# Patient Record
Sex: Female | Born: 1985 | Race: Black or African American | Hispanic: No | Marital: Single | Smoking: Never smoker
Health system: Southern US, Community
[De-identification: ages and names within clinical notes are randomized; demographics above are authoritative.]

## PROBLEM LIST (undated history)

## (undated) DIAGNOSIS — Z789 Other specified health status: Secondary | ICD-10-CM

## (undated) HISTORY — PX: NO PAST SURGERIES: SHX2092

---

## 2011-02-12 ENCOUNTER — Encounter: Payer: Self-pay | Admitting: *Deleted

## 2011-02-12 ENCOUNTER — Emergency Department (HOSPITAL_BASED_OUTPATIENT_CLINIC_OR_DEPARTMENT_OTHER)
Admission: EM | Admit: 2011-02-12 | Discharge: 2011-02-13 | Disposition: A | Discharge status: Home / Self Care | Payer: BC Managed Care – PPO | Attending: Emergency Medicine | Admitting: Emergency Medicine

## 2011-02-12 DIAGNOSIS — T7840XA Allergy, unspecified, initial encounter: Secondary | ICD-10-CM

## 2011-02-12 DIAGNOSIS — L509 Urticaria, unspecified: Secondary | ICD-10-CM | POA: Insufficient documentation

## 2011-02-12 MED ORDER — SODIUM CHLORIDE 0.9 % IV SOLN
INTRAVENOUS | Status: DC
  Administered 2011-02-12: 22:00:00 via INTRAVENOUS

## 2011-02-12 MED ORDER — DIPHENHYDRAMINE HCL 50 MG/ML IJ SOLN
25.0000 mg | Freq: Once | INTRAMUSCULAR | Status: AC
  Administered 2011-02-12: 25 mg via INTRAVENOUS
  Filled 2011-02-12: qty 1

## 2011-02-12 MED ORDER — PREDNISONE 50 MG PO TABS: 50.0000 mg | ORAL_TABLET | Freq: Every day | ORAL | Status: AC

## 2011-02-12 MED ORDER — DIPHENHYDRAMINE HCL 25 MG PO TABS: 50.0000 mg | ORAL_TABLET | Freq: Four times a day (QID) | ORAL | Status: DC

## 2011-02-12 MED ORDER — DEXAMETHASONE SODIUM PHOSPHATE 10 MG/ML IJ SOLN
10.0000 mg | Freq: Once | INTRAMUSCULAR | Status: AC
  Administered 2011-02-12: 10 mg via INTRAVENOUS
  Filled 2011-02-12: qty 1

## 2011-02-12 MED ORDER — FAMOTIDINE IN NACL 20-0.9 MG/50ML-% IV SOLN
20.0000 mg | Freq: Once | INTRAVENOUS | Status: AC
  Administered 2011-02-12: 20 mg via INTRAVENOUS
  Filled 2011-02-12: qty 50

## 2011-02-12 NOTE — ED Provider Notes (Addendum)
History     CSN: [id number] Arrival date & time: 02/12/2011  9:22 PM  Chief Complaint  Patient presents with  . Allergic Reaction   HPI Comments: 25yoF previously healthy pw hives. Pt c/o hives b/l UE this morning. States that tonight after she took a shower she began to have hives and itching over her diffuse body. Denies cp/sob/wheezing/throat swelling. Denies h/o similar. Started minocycline for acne 2 weeks ago. Started vit b and fish oil last night. No other new exposures. No h/o similar. Denies fever/chills/cp/sob/n/v. Took one benadryl pta  Patient is a 25 y.o. female presenting with allergic reaction.  Allergic Reaction    History reviewed. No pertinent past medical history.  History reviewed. No pertinent past surgical history.  No family history on file.  History  Substance Use Topics  . Smoking status: Never Smoker   . Smokeless tobacco: Not on file  . Alcohol Use: No    OB History    Grav Para Term Preterm Abortions TAB SAB Ect Mult Living                  Review of Systems  All other systems reviewed and are negative.  except as noted HPI   Physical Exam  BP 113/84  Pulse 103  Temp(Src) 98.2 F (36.8 C) (Oral)  Resp 22  SpO2 99%  Physical Exam  Nursing note and vitals reviewed. Constitutional: She is oriented to person, place, and time. She appears well-developed.  HENT:  Head: Atraumatic.  Mouth/Throat: Oropharynx is clear and moist.  Eyes: Conjunctivae and EOM are normal. Pupils are equal, round, and reactive to light.  Neck: Normal range of motion. Neck supple.       No stridor no tongue swelling  Cardiovascular: Normal rate, regular rhythm, normal heart sounds and intact distal pulses.   Pulmonary/Chest: Effort normal and breath sounds normal. No respiratory distress. She has no wheezes. She has no rales.  Abdominal: Soft. She exhibits no distension. There is no tenderness. There is no rebound and no guarding.  Musculoskeletal: Normal range  of motion.  Neurological: She is alert and oriented to person, place, and time.  Skin: Skin is warm and dry. No rash noted.       Diffuse urticaria > face neck Scattered excoriations  Psychiatric: She has a normal mood and affect.    ED Course  Procedures  MDM Allergic reaction possibly from new medications. No anaphylaxis.  IV benadryl, pepcid, decadron. Obs and reassess. Anticipate d/c home   Stefano Gaul, MD   11:51 PM  Hives resolving, itching resolved. I do not think she needs an 8 hour observation for her mild-moderate rxn. Pt continues to deny tongue swelling/neck pain/swelling. There is no wheezing. Will discharge home with steroid x 5 days and benadryl. Precautions for return.  Forbes Cellar, MD 02/12/11 2352  Forbes Cellar, MD 02/12/11 [phone]

## 2011-02-12 NOTE — ED Notes (Signed)
Hives this am on her elbows. Two hours ago she had hives over her body. Took an allergy pill 30 mins ago. Skin is burning.

## 2011-02-12 NOTE — ED Notes (Signed)
Pt given ginger ale to drink. 

## 2011-02-12 NOTE — ED Notes (Signed)
States she started taking Amoxicillin 2 weeks ago for her acne.

## 2016-05-11 ENCOUNTER — Encounter (HOSPITAL_COMMUNITY): Payer: Self-pay | Admitting: *Deleted

## 2016-05-11 ENCOUNTER — Emergency Department (HOSPITAL_COMMUNITY)
Admission: EM | Admit: 2016-05-11 | Discharge: 2016-05-11 | Disposition: A | Discharge status: Home / Self Care | Payer: PRIVATE HEALTH INSURANCE | Attending: Emergency Medicine | Admitting: Emergency Medicine

## 2016-05-11 DIAGNOSIS — R1084 Generalized abdominal pain: Secondary | ICD-10-CM | POA: Diagnosis not present

## 2016-05-11 DIAGNOSIS — E860 Dehydration: Secondary | ICD-10-CM

## 2016-05-11 DIAGNOSIS — R110 Nausea: Secondary | ICD-10-CM

## 2016-05-11 DIAGNOSIS — R112 Nausea with vomiting, unspecified: Secondary | ICD-10-CM | POA: Insufficient documentation

## 2016-05-11 LAB — URINALYSIS, ROUTINE W REFLEX MICROSCOPIC
Bilirubin Urine: NEGATIVE
Glucose, UA: NEGATIVE mg/dL
Ketones, ur: NEGATIVE mg/dL
LEUKOCYTES UA: NEGATIVE
NITRITE: NEGATIVE
Protein, ur: NEGATIVE mg/dL
SPECIFIC GRAVITY, URINE: 1.015 (ref 1.005–1.030)
pH: 7.5 (ref 5.0–8.0)

## 2016-05-11 LAB — CBC
HCT: 42.1 % (ref 36.0–46.0)
HEMOGLOBIN: 13.4 g/dL (ref 12.0–15.0)
MCH: 28.3 pg (ref 26.0–34.0)
MCHC: 31.8 g/dL (ref 30.0–36.0)
MCV: 88.8 fL (ref 78.0–100.0)
Platelets: 309 10*3/uL (ref 150–400)
RBC: 4.74 MIL/uL (ref 3.87–5.11)
RDW: 12.4 % (ref 11.5–15.5)
WBC: 10 10*3/uL (ref 4.0–10.5)

## 2016-05-11 LAB — COMPREHENSIVE METABOLIC PANEL
ALK PHOS: 80 U/L (ref 38–126)
ALT: 19 U/L (ref 14–54)
ANION GAP: 8 (ref 5–15)
AST: 25 U/L (ref 15–41)
Albumin: 4.6 g/dL (ref 3.5–5.0)
BILIRUBIN TOTAL: 1.6 mg/dL — AB (ref 0.3–1.2)
BUN: 13 mg/dL (ref 6–20)
CALCIUM: 9.4 mg/dL (ref 8.9–10.3)
CO2: 26 mmol/L (ref 22–32)
Chloride: 104 mmol/L (ref 101–111)
Creatinine, Ser: 0.67 mg/dL (ref 0.44–1.00)
GFR calc non Af Amer: >60 mL/min (ref >60)
Glucose, Bld: 89 mg/dL (ref 65–99)
POTASSIUM: 2.9 mmol/L — AB (ref 3.5–5.1)
SODIUM: 138 mmol/L (ref 135–145)
TOTAL PROTEIN: 8.6 g/dL — AB (ref 6.5–8.1)

## 2016-05-11 LAB — URINE MICROSCOPIC-ADD ON
BACTERIA UA: NONE SEEN
RBC / HPF: NONE SEEN RBC/hpf (ref 0–5)
SQUAMOUS EPITHELIAL / LPF: NONE SEEN

## 2016-05-11 LAB — LIPASE, BLOOD: Lipase: 24 U/L (ref 11–51)

## 2016-05-11 LAB — POC URINE PREG, ED: PREG TEST UR: NEGATIVE

## 2016-05-11 MED ORDER — POTASSIUM CHLORIDE CRYS ER 20 MEQ PO TBCR
40.0000 meq | EXTENDED_RELEASE_TABLET | Freq: Once | ORAL | Status: AC
  Administered 2016-05-11: 40 meq via ORAL
  Filled 2016-05-11: qty 2

## 2016-05-11 MED ORDER — ONDANSETRON 4 MG PO TBDP
4.0000 mg | ORAL_TABLET | Freq: Once | ORAL | Status: AC | PRN
  Administered 2016-05-11: 4 mg via ORAL
  Filled 2016-05-11: qty 1

## 2016-05-11 MED ORDER — SODIUM CHLORIDE 0.9 % IV BOLUS (SEPSIS)
1000.0000 mL | Freq: Once | INTRAVENOUS | Status: AC
  Administered 2016-05-11: 1000 mL via INTRAVENOUS

## 2016-05-11 MED ORDER — ONDANSETRON HCL 4 MG PO TABS: 4.0000 mg | ORAL_TABLET | Freq: Three times a day (TID) | ORAL | 0 refills | Status: DC | PRN

## 2016-05-11 NOTE — Discharge Instructions (Signed)
Please maintain her hydration and take nausea medicine as needed. Please follow-up with your primary physician for further management. If symptoms return or worsen, please return to the nearest emergency department.

## 2016-05-11 NOTE — ED Provider Notes (Signed)
[facility]-EMERGENCY DEPT Provider Note   CSN: [id number] Arrival date & time: 05/11/16  1318     History   Chief Complaint Chief Complaint  Patient presents with  . Abdominal Pain    HPI Jasmine Mcbride is a 30 y.o. female with past medical history significant for allergic reactions who presents with fatigue, malaise, chills, nausea, and full body aching. Patient says that she has had positive sick contacts over the last few days at work. She reports that she was in a long the vehicle ride with somebody who was vomiting and "had a bug". She says that she has had nausea with no vomiting, fatigue, and generalized malaise. She says that she has had decreased by mouth intake over the last few days and has not eaten anything today due to nausea. She reports she's had some moderate abdominal aching and no specific location. She denies any sharp pain. She denies any right lower quadrant or suprapubic pain. Not taking any medicine to help her symptoms. She does report some chronic arm and constipation. She denies any change in her urination including no dysuria, frequency, hesitancy. She denies any diarrhea. She denies any rhinorrhea, congestion, or cough. She reports that she was feeling lightheaded today probably her to sit down.  He denies any chest pain, shortness of breath, back pain, hematuria, headache, or vision changes. She denies any neck stiffness or neck pain.    The history is provided by the patient and medical records. No language interpreter was used.  Abdominal Pain   This is a new problem. The current episode started 2 days ago. The problem occurs constantly. The problem has been gradually improving. The pain is associated with sick contacts. The pain is located in the generalized abdominal region. The quality of the pain is aching. The pain is mild. Associated symptoms include anorexia, nausea and constipation. Pertinent negatives include fever, diarrhea, vomiting, dysuria, frequency,  hematuria and headaches. Nothing aggravates the symptoms. Nothing relieves the symptoms.      History reviewed. No pertinent past medical history.  There are no active problems to display for this patient.   History reviewed. No pertinent surgical history.  OB History    No data available       Home Medications    Prior to Admission medications   Medication Sig Start Date End Date Taking? Authorizing Provider  cetirizine (ZYRTEC) 10 MG tablet Take 10 mg by mouth daily.   Yes Historical Provider, MD  diphenhydrAMINE (BENADRYL) 25 MG tablet Take 2 tablets (50 mg total) by mouth every 6 (six) hours. Patient taking differently: Take 50 mg by mouth every 6 (six) hours as needed for itching or allergies.  02/12/11 05/11/16 Yes Forbes CellarLeigh-Ann Webb, MD  vitamin C (ASCORBIC ACID) 500 MG tablet Take 500 mg by mouth daily.   Yes Historical Provider, MD    Family History No family history on file.  Social History Social History  Substance Use Topics  . Smoking status: Never Smoker  . Smokeless tobacco: Never Used  . Alcohol use Yes     Allergies   Patient has no known allergies.   Review of Systems Review of Systems  Constitutional: Negative for activity change, chills, diaphoresis, fatigue and fever.  HENT: Negative for congestion and rhinorrhea.   Eyes: Negative for visual disturbance.  Respiratory: Negative for cough, chest tightness, shortness of breath and stridor.   Cardiovascular: Negative for chest pain, palpitations and leg swelling.  Gastrointestinal: Positive for abdominal pain, anorexia, constipation and  nausea. Negative for abdominal distention, diarrhea and vomiting.  Genitourinary: Negative for difficulty urinating, dysuria, flank pain, frequency, hematuria, menstrual problem, pelvic pain, vaginal bleeding and vaginal discharge.  Musculoskeletal: Negative for back pain and neck pain.  Skin: Negative for rash and wound.  Neurological: Positive for light-headedness  (resolved). Negative for dizziness, seizures, weakness, numbness and headaches.  Psychiatric/Behavioral: Negative for agitation and confusion.  All other systems reviewed and are negative.    Physical Exam Updated Vital Signs BP 125/76   Pulse 78   Temp 98.2 F (36.8 C)   Resp 16   LMP 05/11/2016   SpO2 100%   Physical Exam  Constitutional: She appears well-developed and well-nourished. No distress.  HENT:  Head: Normocephalic and atraumatic.  Mouth/Throat: Oropharynx is clear and moist. No oropharyngeal exudate.  Eyes: Conjunctivae and EOM are normal. Pupils are equal, round, and reactive to light.  Neck: Normal range of motion. Neck supple.  Cardiovascular: Normal rate, regular rhythm and normal heart sounds.   No murmur heard. Pulmonary/Chest: Effort normal and breath sounds normal. No stridor. No respiratory distress. She has no wheezes. She has no rales. She exhibits no tenderness.  Abdominal: Soft. There is no tenderness.  Musculoskeletal: She exhibits no edema or tenderness.  Neurological: She is alert. She displays normal reflexes. No cranial nerve deficit or sensory deficit. She exhibits normal muscle tone. Coordination normal.  Skin: Skin is warm and dry. Capillary refill takes less than 2 seconds. No rash noted. No erythema.  Psychiatric: She has a normal mood and affect.  Nursing note and vitals reviewed.    ED Treatments / Results  Labs (all labs ordered are listed, but only abnormal results are displayed) Labs Reviewed  COMPREHENSIVE METABOLIC PANEL - Abnormal; Notable for the following:       Result Value   Potassium 2.9 (*)    Total Protein 8.6 (*)    Total Bilirubin 1.6 (*)    All other components within normal limits  URINALYSIS, ROUTINE W REFLEX MICROSCOPIC (NOT AT [facility]) - Abnormal; Notable for the following:    Hgb urine dipstick MODERATE (*)    All other components within normal limits  LIPASE, BLOOD  CBC  URINE MICROSCOPIC-ADD ON  POC URINE  PREG, ED    EKG  EKG Interpretation None       Radiology No results found.  Procedures Procedures (including critical care time)  Medications Ordered in ED Medications  ondansetron (ZOFRAN-ODT) disintegrating tablet 4 mg (4 mg Oral Given 05/11/16 1342)  sodium chloride 0.9 % bolus 1,000 mL (0 mLs Intravenous Stopped 05/11/16 1738)  potassium chloride SA (K-DUR,KLOR-CON) CR tablet 40 mEq (40 mEq Oral Given 05/11/16 1428)     Initial Impression / Assessment and Plan / ED Course  I have reviewed the triage vital signs and the nursing notes.  Pertinent labs & imaging results that were available during my care of the patient were reviewed by me and considered in my medical decision making (see chart for details).  Clinical Course     Adiana TECKLA CHRISTIANSEN is a 30 y.o. female with past medical history significant for allergic reactions who presents with fatigue, malaise, chills, nausea, and full body aching.  History and exam are seen above.  On exam, patient has no focal neurologic deficits. Patient's abdomen is nontender. Patient's lungs are clear with no wheezing. Patient has no audible congestion or stridor. Patient has full range of motion of neck with unremarkable oropharyngeal exam.  Based on patient's history, suspect  likely viral infection causing chills and malaise. Suspect patient is dehydrated given decreased by mouth intake and her current nausea. Patient's lungs are clear. Do not feel patient requires chest x-ray at this time given clear lung exam and lack of pulmonary complaints. Patient will have screening laboratory testing for abdominal aching as well as looking for occult infection in the urine. Patient given Zofran with improvement in nausea. Patient will be given a liter of fluids.  Patient is currently on her menstrual cycle and denies any changes in this. Preg test negative.  Initial laboratory testing returned showing hypokalemia. Patient was supplemented with  potassium.  Anticipate reassessing following fluid resuscitation  Other laboratory testing showed no evidence of UTI, normal lipase, unremarkable CBC, and CMP again revealing the hypokalemia.   Patient felt much better with near resolution of symptoms after fluids and nausea medicine.   Given reassuring workup and improvement in symptoms, feel patient is stable for discharge. Patient will be discharged home with Zofran for nausea and to help her maintain hydration. Patient will follow-up with a PCP for further management. Patient understood return precautions. Patient had no other questions or concerns and was discharged in good condition.   Final Clinical Impressions(s) / ED Diagnoses   Final diagnoses:  Generalized abdominal pain  Nausea  Dehydration    New Prescriptions Discharge Medication List as of 05/11/2016  5:32 PM    START taking these medications   Details  ondansetron (ZOFRAN) 4 MG tablet Take 1 tablet (4 mg total) by mouth every 8 (eight) hours as needed for nausea or vomiting., Starting Tue 05/11/2016, Print        Clinical Impression: 1. Generalized abdominal pain   2. Nausea   3. Dehydration     Disposition: Discharge  Condition: Good  I have discussed the results, Dx and Tx plan with the pt(& family if present). He/she/they expressed understanding and agree(s) with the plan. Discharge instructions discussed at great length. Strict return precautions discussed and pt &/or family have verbalized understanding of the instructions. No further questions at time of discharge.    Discharge Medication List as of 05/11/2016  5:32 PM    START taking these medications   Details  ondansetron (ZOFRAN) 4 MG tablet Take 1 tablet (4 mg total) by mouth every 8 (eight) hours as needed for nausea or vomiting., Starting Tue 05/11/2016, Print        Follow Up: [facility] [address] [place]  [id number] [phone] Schedule an appointment as soon as possible for a visit    [facility] [address] [id number] [place] [phone]  If symptoms worsen     Heide Scaleshristopher J Aracelie Addis, MD 05/12/16 1109

## 2016-05-11 NOTE — ED Triage Notes (Signed)
Per EMS, pt complains of abdominal pain, weakness, nausea for the past hour. Pt was at work and had to sit down because she felt like she was going to pass out. Pt is currently on menstrual period. Bp 120/71, HR 80, 99% RA, CBG 138.

## 2017-04-25 LAB — OB RESULTS CONSOLE RUBELLA ANTIBODY, IGM: RUBELLA: IMMUNE

## 2017-04-25 LAB — OB RESULTS CONSOLE GC/CHLAMYDIA
CHLAMYDIA, DNA PROBE: NEGATIVE
Gonorrhea: NEGATIVE

## 2017-04-25 LAB — OB RESULTS CONSOLE ABO/RH: RH Type: POSITIVE

## 2017-04-25 LAB — OB RESULTS CONSOLE HEPATITIS B SURFACE ANTIGEN: HEP B S AG: NEGATIVE

## 2017-04-25 LAB — OB RESULTS CONSOLE RPR: RPR: NONREACTIVE

## 2017-04-25 LAB — OB RESULTS CONSOLE HIV ANTIBODY (ROUTINE TESTING): HIV: NONREACTIVE

## 2017-06-14 NOTE — L&D Delivery Note (Signed)
Delivery Note Called aroun 10:15 with notification that pt desired epidural and was 6cm.  Tracing cat 1.  After epidural, pt was FD/+2.  At 11:14 AM a viable and healthy female was delivered via  (Presentation: cephalic).  APGAR: pending; weight  pending.   Placenta status:spont, intact.  Cord: 3V with the following complications: none.  Cord pH: n/a  Anesthesia:  Epidural Episiotomy:  none Lacerations:  Bilateral labial lacs Suture Repair: 3.0 vicryl Est. Blood Loss (mL):  300 cc  Mom to postpartum.  Baby to NICU.  Purcell Nails 10/25/2017, 11:40 AM

## 2017-10-22 ENCOUNTER — Inpatient Hospital Stay (HOSPITAL_COMMUNITY)
Admission: AD | Admit: 2017-10-22 | Discharge: 2017-10-27 | DRG: 805 | Disposition: A | Discharge status: Home / Self Care | Payer: Managed Care, Other (non HMO) | Attending: Obstetrics & Gynecology | Admitting: Obstetrics & Gynecology

## 2017-10-22 ENCOUNTER — Encounter (HOSPITAL_COMMUNITY): Payer: Self-pay | Admitting: *Deleted

## 2017-10-22 DIAGNOSIS — O42913 Preterm premature rupture of membranes, unspecified as to length of time between rupture and onset of labor, third trimester: Principal | ICD-10-CM | POA: Diagnosis present

## 2017-10-22 DIAGNOSIS — O42113 Preterm premature rupture of membranes, onset of labor more than 24 hours following rupture, third trimester: Secondary | ICD-10-CM | POA: Diagnosis present

## 2017-10-22 DIAGNOSIS — Z3A34 34 weeks gestation of pregnancy: Secondary | ICD-10-CM

## 2017-10-22 DIAGNOSIS — O99824 Streptococcus B carrier state complicating childbirth: Secondary | ICD-10-CM | POA: Diagnosis present

## 2017-10-22 DIAGNOSIS — O98813 Other maternal infectious and parasitic diseases complicating pregnancy, third trimester: Secondary | ICD-10-CM | POA: Diagnosis present

## 2017-10-22 DIAGNOSIS — Z3490 Encounter for supervision of normal pregnancy, unspecified, unspecified trimester: Secondary | ICD-10-CM

## 2017-10-22 HISTORY — DX: Other specified health status: Z78.9

## 2017-10-22 NOTE — MAU Note (Signed)
Pt presents to MAU with complaints of ROM.  States big gush of clear fluid around 2130 with continued leaking of fluid.  Denies vaginal bleeding. +FM

## 2017-10-22 NOTE — MAU Note (Signed)
Pt reports LOF since 2130 and leaking since. + Fm Denies bleeding. Denies contractions. + Heartburn took tums no relief.

## 2017-10-23 ENCOUNTER — Inpatient Hospital Stay (HOSPITAL_COMMUNITY): Payer: Managed Care, Other (non HMO)

## 2017-10-23 ENCOUNTER — Other Ambulatory Visit: Payer: Self-pay

## 2017-10-23 DIAGNOSIS — O98813 Other maternal infectious and parasitic diseases complicating pregnancy, third trimester: Secondary | ICD-10-CM | POA: Diagnosis present

## 2017-10-23 DIAGNOSIS — Z3A34 34 weeks gestation of pregnancy: Secondary | ICD-10-CM | POA: Diagnosis not present

## 2017-10-23 DIAGNOSIS — O42113 Preterm premature rupture of membranes, onset of labor more than 24 hours following rupture, third trimester: Secondary | ICD-10-CM | POA: Diagnosis present

## 2017-10-23 DIAGNOSIS — O99824 Streptococcus B carrier state complicating childbirth: Secondary | ICD-10-CM | POA: Diagnosis present

## 2017-10-23 DIAGNOSIS — O42913 Preterm premature rupture of membranes, unspecified as to length of time between rupture and onset of labor, third trimester: Secondary | ICD-10-CM | POA: Diagnosis present

## 2017-10-23 LAB — TYPE AND SCREEN
ABO/RH(D): A POS
Antibody Screen: NEGATIVE

## 2017-10-23 LAB — GROUP B STREP BY PCR: GROUP B STREP BY PCR: POSITIVE — AB

## 2017-10-23 LAB — URINALYSIS, ROUTINE W REFLEX MICROSCOPIC
Bilirubin Urine: NEGATIVE
GLUCOSE, UA: NEGATIVE mg/dL
HGB URINE DIPSTICK: NEGATIVE
Ketones, ur: NEGATIVE mg/dL
Leukocytes, UA: NEGATIVE
Nitrite: NEGATIVE
Protein, ur: NEGATIVE mg/dL
SPECIFIC GRAVITY, URINE: 1.012 (ref 1.005–1.030)
pH: 8 (ref 5.0–8.0)

## 2017-10-23 LAB — CBC
HCT: 30.7 % — ABNORMAL LOW (ref 36.0–46.0)
HEMOGLOBIN: 9.9 g/dL — AB (ref 12.0–15.0)
MCH: 28.4 pg (ref 26.0–34.0)
MCHC: 32.2 g/dL (ref 30.0–36.0)
MCV: 88.2 fL (ref 78.0–100.0)
PLATELETS: 210 10*3/uL (ref 150–400)
RBC: 3.48 MIL/uL — AB (ref 3.87–5.11)
RDW: 13.7 % (ref 11.5–15.5)
WBC: 8.2 10*3/uL (ref 4.0–10.5)

## 2017-10-23 LAB — ABO/RH: ABO/RH(D): A POS

## 2017-10-23 LAB — POCT FERN TEST: POCT Fern Test: POSITIVE

## 2017-10-23 MED ORDER — BETAMETHASONE SOD PHOS & ACET 6 (3-3) MG/ML IJ SUSP
12.0000 mg | INTRAMUSCULAR | Status: AC
  Administered 2017-10-23: 12 mg via INTRAMUSCULAR
  Filled 2017-10-23: qty 2

## 2017-10-23 MED ORDER — CALCIUM CARBONATE ANTACID 500 MG PO CHEW
2.0000 | CHEWABLE_TABLET | ORAL | Status: DC | PRN
  Administered 2017-10-23 – 2017-10-25 (×2): 400 mg via ORAL
  Filled 2017-10-23 (×2): qty 2

## 2017-10-23 MED ORDER — AZITHROMYCIN 250 MG PO TABS
1000.0000 mg | ORAL_TABLET | Freq: Once | ORAL | Status: AC
  Administered 2017-10-23: 1000 mg via ORAL
  Filled 2017-10-23: qty 4

## 2017-10-23 MED ORDER — AMOXICILLIN 500 MG PO CAPS
500.0000 mg | ORAL_CAPSULE | Freq: Three times a day (TID) | ORAL | Status: DC
  Filled 2017-10-23 (×2): qty 1

## 2017-10-23 MED ORDER — LACTATED RINGERS IV SOLN
INTRAVENOUS | Status: DC
  Administered 2017-10-23 – 2017-10-24 (×4): via INTRAVENOUS

## 2017-10-23 MED ORDER — BETAMETHASONE SOD PHOS & ACET 6 (3-3) MG/ML IJ SUSP
12.0000 mg | INTRAMUSCULAR | Status: DC
  Administered 2017-10-23: 12 mg via INTRAMUSCULAR
  Filled 2017-10-23: qty 2

## 2017-10-23 MED ORDER — FERROUS SULFATE 325 (65 FE) MG PO TABS
325.0000 mg | ORAL_TABLET | Freq: Every day | ORAL | Status: DC
Start: 2017-10-24 — End: 2017-10-25
  Administered 2017-10-24: 325 mg via ORAL
  Filled 2017-10-23 (×2): qty 1

## 2017-10-23 MED ORDER — DOCUSATE SODIUM 100 MG PO CAPS
100.0000 mg | ORAL_CAPSULE | Freq: Every day | ORAL | Status: DC
  Administered 2017-10-23 – 2017-10-24 (×2): 100 mg via ORAL
  Filled 2017-10-23 (×3): qty 1

## 2017-10-23 MED ORDER — SODIUM CHLORIDE 0.9 % IV SOLN
2.0000 g | Freq: Four times a day (QID) | INTRAVENOUS | Status: AC
  Administered 2017-10-23 – 2017-10-24 (×8): 2 g via INTRAVENOUS
  Filled 2017-10-23 (×8): qty 2

## 2017-10-23 MED ORDER — FAMOTIDINE 20 MG PO TABS
20.0000 mg | ORAL_TABLET | Freq: Every day | ORAL | Status: DC
  Administered 2017-10-23 – 2017-10-24 (×3): 20 mg via ORAL
  Filled 2017-10-23 (×3): qty 1

## 2017-10-23 MED ORDER — MAGNESIUM SULFATE BOLUS VIA INFUSION
4.0000 g | Freq: Once | INTRAVENOUS | Status: AC
  Administered 2017-10-23: 4 g via INTRAVENOUS
  Filled 2017-10-23: qty 500

## 2017-10-23 MED ORDER — PRENATAL MULTIVITAMIN CH
1.0000 | ORAL_TABLET | Freq: Every day | ORAL | Status: DC
  Administered 2017-10-23 – 2017-10-24 (×2): 1 via ORAL
  Filled 2017-10-23 (×4): qty 1

## 2017-10-23 MED ORDER — ZOLPIDEM TARTRATE 5 MG PO TABS
5.0000 mg | ORAL_TABLET | Freq: Every evening | ORAL | Status: DC | PRN
  Administered 2017-10-23 – 2017-10-25 (×2): 5 mg via ORAL
  Filled 2017-10-23 (×2): qty 1

## 2017-10-23 MED ORDER — ACETAMINOPHEN 325 MG PO TABS
650.0000 mg | ORAL_TABLET | ORAL | Status: DC | PRN
  Administered 2017-10-23: 650 mg via ORAL
  Filled 2017-10-23: qty 2

## 2017-10-23 MED ORDER — MAGNESIUM SULFATE 40 G IN LACTATED RINGERS - SIMPLE
2.0000 g/h | INTRAVENOUS | Status: AC
  Filled 2017-10-23: qty 500

## 2017-10-23 NOTE — Consult Note (Signed)
Asked by Dr.Roberts to provide prenatal consultation for patient at risk for preterm delivery due to PPROM at 33.[redacted] wks EGA . Patient is 32 y.o. G2 P0, pregnancy previoulsly uncomplicated.  She is being treated with betamethasone (2nd dose about 12:30 today), MgSO4, and antibiotics.  No signs of infection at this time.  Discussed usual expectations for preterm infant at 19 - [redacted] weeks gestation, including possible needs for DR resuscitation, respiratory support, IV access.  Projected possible length of stay in NICU until 37 - [redacted] wks EGA.  Discussed advantages of feeding with mother's milk.  She plans to pump postnatally.  Also mentioned possible use of donor milk.  Patient was attentive, had appropriate questions, and was appreciative of my input.  Thank you for consulting Neonatology.  Total time 20  JWimmer, MD

## 2017-10-23 NOTE — H&P (Signed)
Jasmine Mcbride is a 32 y.o. female presenting for PPROM at [redacted]w[redacted]d. Patient reports gush of fluid at 2130 with occasional mild contractions.   OB History    Gravida  2   Para      Term      Preterm      AB  1   Living        SAB  1   TAB      Ectopic      Multiple      Live Births             Past Medical History:  Diagnosis Date  . Medical history non-contributory    Past Surgical History:  Procedure Laterality Date  . NO PAST SURGERIES     Family History: family history is not on file. Social History:  reports that she has never smoked. She has never used smokeless tobacco. She reports that she drinks alcohol. She reports that she does not use drugs.     Maternal Diabetes: No Genetic Screening: Declined Maternal Ultrasounds/Referrals: Normal Fetal Ultrasounds or other Referrals:  Referred to Materal Fetal Medicine today for evaluation of PPROM and Growth U/S Maternal Substance Abuse:  No Significant Maternal Medications:  None Significant Maternal Lab Results:  None Other Comments:  None  Review of Systems  All other systems reviewed and are negative.  Maternal Medical History:  Reason for admission: Rupture of membranes.   Contractions: Frequency: rare.   Perceived severity is mild.    Fetal activity: Perceived fetal activity is normal.   Last perceived fetal movement was within the past hour.    Prenatal complications: no prenatal complications Prenatal Complications - Diabetes: none.    Vitals:   10/22/17 2301  BP: 117/69  Pulse: 83  Resp: 16  Temp: 97.8 F (36.6 C)  SpO2: 98%   Maternal Exam:  Abdomen: Patient reports no abdominal tenderness. Fetal presentation: vertex  Introitus: Normal vulva. Normal vagina.  Ferning test: positive.  Amniotic fluid character: clear.  Pelvis: adequate for delivery.   Cervix: Cervix evaluated by sterile speculum exam.     Physical Exam  Constitutional: She is oriented to person, place, and  time. She appears well-developed and well-nourished.  HENT:  Head: Normocephalic and atraumatic.  Eyes: Pupils are equal, round, and reactive to light. Conjunctivae are normal.  Cardiovascular: Normal rate, regular rhythm and normal heart sounds.  Respiratory: Effort normal and breath sounds normal.  GI: Soft. Bowel sounds are normal.  Genitourinary: Vagina normal.  Musculoskeletal: Normal range of motion.  Neurological: She is alert and oriented to person, place, and time.  Skin: Skin is warm and dry.  Psychiatric: She has a normal mood and affect. Her behavior is normal. Judgment and thought content normal.  Cervical exam: closed, long, firm, posterior with clear fluid pooling at posterior fornix.   Prenatal labs: ABO, Rh:  A+ Antibody:  Negative Rubella:  Immune RPR:   Negative HBsAg:   Nonreactive HIV:   Nonreactive  GBS:   Unknown  Assessment/Plan: G2P0010 at [redacted]w[redacted]d  PPROM GBS status unknown  Admit to antepartum Betamethasone x2 doses Zithromax 1g once, Ampicillin 2g q6hrs for 48 hours then Amoxicillin 500 po TID for 5 days Labs: CBC and GBS  Consult Dr. Mora Appl Consult MFM Growth U/S    [name] 10/23/2017, 12:33 AM

## 2017-10-23 NOTE — Progress Notes (Addendum)
Patient ID: Jasmine Mcbride, female   DOB: 02/28/1986, 32 y.o.   MRN: [id number] Jasmine Mcbride is a 32 y.o. G2P0010 at [redacted]w[redacted]d admitted for PPROM clear fluid  Hospital Day No: 1  Subjective: Pt reporting contractions q5 min.  RN reports her breathing through them.  The nurse, Candise Bowens, called me after pt called out reporting contractions. Orders given.  Upon my arrival, pt denied any VB.  Objective: BP 104/65 (BP Location: Left Arm)   Pulse (!) 103   Temp 98.8 F (37.1 C) (Oral)   Resp 18   Ht  (1.549 m)   Wt 169 lb (76.7 kg)   LMP 03/05/2017 (Exact Date)   SpO2 97%   BMI 31.93 kg/m  I/O last 3 completed shifts: In: 818 [P.O.:118; I.V.:500; IV Piggyback:200] Out: 350 [Urine:100; Emesis/NG output:250] Total I/O In: -  Out: 500 [Urine:500]  Physical Exam:  Gen: alert and no distress Chest/Lungs: cta bilaterally  Heart/Pulse: RRR  Abdomen: soft, gravid, nontender Uterine fundus: gravid, nontender Skin & Color: warm and dry  EXT: negative Homan's b/l, edema none  FHT:  FHR: 135 bpm, variability: moderate,  accelerations:  Present,  decelerations:  Absent UC:   Not picking up well but q by report from pt and nurse (RN reports pt huffing and puffing q78min) SVE:   Dilation: Closed Effacement (%): 80  Labs: Lab Results  Component Value Date   WBC 8.2 10/23/2017   HGB 9.9 (L) 10/23/2017   HCT 30.7 (L) 10/23/2017   MCV 88.2 10/23/2017   PLT 210 10/23/2017   EFW 5lbs 1oz on u/s this morning, vtx per pt report.  Official report is pending.  Assessment and Plan: has Preterm premature rupture of membranes (PPROM) with onset of labor after 24 hours of rupture in third trimester, antepartum on their problem list.  Mg ordered for neuroprophylaxis when nurse called reporting regular ctxs GBS Cx pending but d/t contractions, will collect GBS PCR Cont amp.  Pt is s/p zithromax x 1 dose. Fetal status is cat 1 Neonatology consult  Purcell Nails 10/23/2017, 11:12 AM

## 2017-10-23 NOTE — Progress Notes (Signed)
Pt back in room from ultrasound.

## 2017-10-23 NOTE — Progress Notes (Addendum)
0214: Tylenol 650 mg administered for abdominal cramping 7/10  2216: Ambien given for insomnia

## 2017-10-23 NOTE — Progress Notes (Signed)
0540: Pt C/O feeling hot and vomited 250 mls green liquid. No nausea medication required. We will continue to monitor.

## 2017-10-23 NOTE — Progress Notes (Signed)
CRITICAL VALUE ALERT  Critical Value:  GBS +  Date & Time Notied:  10/23/17  Provider Notified: Dr. Su Hilt  Orders Received/Actions taken: No new orders at this time.

## 2017-10-23 NOTE — Progress Notes (Signed)
Neonatologist aware of neo consult order. 

## 2017-10-23 NOTE — Progress Notes (Signed)
Pt going down for ultrasound. 

## 2017-10-23 NOTE — Progress Notes (Signed)
Possibly tracing maternal HR from 09:46 to 09:53. Went to assess the pt. Pt was sitting up in bed to eat. Re-adjusted monitor.

## 2017-10-24 ENCOUNTER — Inpatient Hospital Stay (HOSPITAL_COMMUNITY): Payer: Managed Care, Other (non HMO)

## 2017-10-24 LAB — CULTURE, BETA STREP (GROUP B ONLY)

## 2017-10-24 LAB — OB RESULTS CONSOLE GBS: STREP GROUP B AG: POSITIVE

## 2017-10-24 NOTE — Progress Notes (Addendum)
Mcbride, Jasmine, DOB 11/27/1985  Subjective: Patient reports some thighs swelling.  Otherwise denies any fevers/chills/abdominal pain.She continues with small leakage of amniotic fluid.  She denies contractions, or vaginal bleeding. With normal gross fetal movement.  Objective: Blood pressure 116/69, pulse 83, temperature 98.1 F (36.7 C), temperature source Oral, resp. rate 18, height  (1.549 m), weight 76.7 kg (169 lb), last menstrual period 03/05/2017, SpO2 100 %. General: alert, cooperative and no distress Resp: clear to auscultation bilaterally Cardio: regular rate and rhythm, S1, S2 normal, no murmur, click, rub or gallop GI: soft, non-tender; bowel sounds normal; no masses,  no organomegaly Extremities: edema 1+ bilaterally in bilateral lower extremities.  No erythema or evidence of DVTs.  NST: category 1.  TOCO: No contractions.  Assessment/Plan: 32 y/o G2P0010 @ 33 weeks 6 days EGA with PPROM,  -Beta complete today 10/24/17 at 1230pm.  -On latency antibiotics - S/p magnesium sulfate for neuroprotection and tocolysis earlier in admission -As per MFM consultation plan is to induce patient at 34 weeks- tonight.  Last exam patient was closed, thick.   -Pt. Is s/p NICU consultation.   LOS: 1 day  Konrad Felix, MD.  10/24/2017, 11:20 AM

## 2017-10-24 NOTE — Consult Note (Signed)
Maternal Fetal Medicine Consultation  Requesting Provider(s): [facility]  Primary OB: [facility] Reason for consultation: PPROM  HPI: 31yo P0010 at 33+6 weeks with uncomplicated PPROM with gross PROM and low AFI OB History: OB History    Gravida  2   Para      Term      Preterm      AB  1   Living        SAB  1   TAB      Ectopic      Multiple      Live Births              PMH:  Past Medical History:  Diagnosis Date  . Medical history non-contributory     PSH:  Past Surgical History:  Procedure Laterality Date  . NO PAST SURGERIES     Meds: Has completed betamethasone series. On ampicillin and azithromycin for latency antibiotics Allergies: doxycycline FH: See EPIC Soc: See EPIC  Review of Systems: no vaginal bleeding or cramping/contractions, no LOF, no nausea/vomiting. All other systems reviewed and are negative.  PE:  VS: See EPIC GEN: well-appearing female ABD: gravid, NT  Please see separate document for fetal ultrasound report.  A/P: Uncomplicated PPROM at 33+6 weeks. All preparatory actions have been taken. Recommend proceeding with delivery tomorrow at 34+0 weeks. Discussed these recommendations with the patient and her family  Thank you for the opportunity to be a part of the care of Jasmine Mcbride. Please contact our office if we can be of further assistance.   I spent approximately 15 minutes with this patient with over 50% of time spent in face-to-face counseling.

## 2017-10-25 ENCOUNTER — Inpatient Hospital Stay (HOSPITAL_COMMUNITY): Payer: Managed Care, Other (non HMO) | Admitting: Anesthesiology

## 2017-10-25 ENCOUNTER — Encounter (HOSPITAL_COMMUNITY): Payer: Self-pay | Admitting: *Deleted

## 2017-10-25 DIAGNOSIS — Z3490 Encounter for supervision of normal pregnancy, unspecified, unspecified trimester: Secondary | ICD-10-CM

## 2017-10-25 LAB — CBC
HCT: 27.6 % — ABNORMAL LOW (ref 36.0–46.0)
Hemoglobin: 8.8 g/dL — ABNORMAL LOW (ref 12.0–15.0)
MCH: 28.7 pg (ref 26.0–34.0)
MCHC: 31.9 g/dL (ref 30.0–36.0)
MCV: 89.9 fL (ref 78.0–100.0)
Platelets: 212 10*3/uL (ref 150–400)
RBC: 3.07 MIL/uL — AB (ref 3.87–5.11)
RDW: 14.2 % (ref 11.5–15.5)
WBC: 12.2 10*3/uL — AB (ref 4.0–10.5)

## 2017-10-25 LAB — TYPE AND SCREEN
ABO/RH(D): A POS
Antibody Screen: NEGATIVE

## 2017-10-25 LAB — RPR: RPR Ser Ql: NONREACTIVE

## 2017-10-25 MED ORDER — OXYCODONE-ACETAMINOPHEN 5-325 MG PO TABS: 1.0000 | ORAL_TABLET | ORAL | Status: DC | PRN

## 2017-10-25 MED ORDER — LIDOCAINE HCL (PF) 1 % IJ SOLN
30.0000 mL | INTRAMUSCULAR | Status: AC | PRN
  Administered 2017-10-25: 30 mL via SUBCUTANEOUS
  Filled 2017-10-25: qty 30

## 2017-10-25 MED ORDER — OXYCODONE HCL 5 MG PO TABS: 10.0000 mg | ORAL_TABLET | ORAL | Status: DC | PRN

## 2017-10-25 MED ORDER — FENTANYL 2.5 MCG/ML BUPIVACAINE 1/10 % EPIDURAL INFUSION (WH - ANES)
14.0000 mL/h | INTRAMUSCULAR | Status: DC | PRN
  Administered 2017-10-25: 14 mL/h via EPIDURAL
  Filled 2017-10-25: qty 100

## 2017-10-25 MED ORDER — LIDOCAINE HCL (PF) 1 % IJ SOLN
INTRAMUSCULAR | Status: DC | PRN
  Administered 2017-10-25 (×2): 5 mL via EPIDURAL

## 2017-10-25 MED ORDER — SOD CITRATE-CITRIC ACID 500-334 MG/5ML PO SOLN: 30.0000 mL | ORAL | Status: DC | PRN

## 2017-10-25 MED ORDER — MISOPROSTOL 200 MCG PO TABS: 800.0000 ug | ORAL_TABLET | Freq: Once | ORAL | Status: DC

## 2017-10-25 MED ORDER — PENICILLIN G POT IN DEXTROSE 60000 UNIT/ML IV SOLN
3.0000 10*6.[IU] | INTRAVENOUS | Status: DC
  Administered 2017-10-25: 3 10*6.[IU] via INTRAVENOUS
  Filled 2017-10-25 (×2): qty 50

## 2017-10-25 MED ORDER — DIPHENHYDRAMINE HCL 25 MG PO CAPS: 25.0000 mg | ORAL_CAPSULE | Freq: Four times a day (QID) | ORAL | Status: DC | PRN

## 2017-10-25 MED ORDER — EPHEDRINE 5 MG/ML INJ
10.0000 mg | INTRAVENOUS | Status: DC | PRN
  Filled 2017-10-25: qty 2

## 2017-10-25 MED ORDER — COCONUT OIL OIL: 1.0000 "application " | TOPICAL_OIL | Status: DC | PRN

## 2017-10-25 MED ORDER — ACETAMINOPHEN 325 MG PO TABS: 650.0000 mg | ORAL_TABLET | ORAL | Status: DC | PRN

## 2017-10-25 MED ORDER — OXYCODONE HCL 5 MG PO TABS: 5.0000 mg | ORAL_TABLET | ORAL | Status: DC | PRN

## 2017-10-25 MED ORDER — LACTATED RINGERS IV SOLN: 500.0000 mL | Freq: Once | INTRAVENOUS | Status: DC

## 2017-10-25 MED ORDER — MISOPROSTOL 200 MCG PO TABS
ORAL_TABLET | ORAL | Status: AC
  Filled 2017-10-25: qty 4

## 2017-10-25 MED ORDER — ONDANSETRON HCL 4 MG PO TABS: 4.0000 mg | ORAL_TABLET | ORAL | Status: DC | PRN

## 2017-10-25 MED ORDER — SIMETHICONE 80 MG PO CHEW: 80.0000 mg | CHEWABLE_TABLET | ORAL | Status: DC | PRN

## 2017-10-25 MED ORDER — SODIUM CHLORIDE 0.9 % IV SOLN
5.0000 10*6.[IU] | Freq: Once | INTRAVENOUS | Status: AC
  Administered 2017-10-25: 5 10*6.[IU] via INTRAVENOUS
  Filled 2017-10-25: qty 5

## 2017-10-25 MED ORDER — ONDANSETRON HCL 4 MG/2ML IJ SOLN: 4.0000 mg | Freq: Four times a day (QID) | INTRAMUSCULAR | Status: DC | PRN

## 2017-10-25 MED ORDER — PHENYLEPHRINE 40 MCG/ML (10ML) SYRINGE FOR IV PUSH (FOR BLOOD PRESSURE SUPPORT)
80.0000 ug | PREFILLED_SYRINGE | INTRAVENOUS | Status: DC | PRN
  Filled 2017-10-25: qty 5

## 2017-10-25 MED ORDER — SENNOSIDES-DOCUSATE SODIUM 8.6-50 MG PO TABS
2.0000 | ORAL_TABLET | ORAL | Status: DC
  Administered 2017-10-25 – 2017-10-26 (×2): 2 via ORAL
  Filled 2017-10-25 (×2): qty 2

## 2017-10-25 MED ORDER — LORATADINE 10 MG PO TABS
10.0000 mg | ORAL_TABLET | Freq: Every day | ORAL | Status: DC | PRN
  Filled 2017-10-25: qty 1

## 2017-10-25 MED ORDER — IBUPROFEN 600 MG PO TABS
600.0000 mg | ORAL_TABLET | Freq: Four times a day (QID) | ORAL | Status: DC
  Administered 2017-10-25 – 2017-10-27 (×7): 600 mg via ORAL
  Filled 2017-10-25 (×7): qty 1

## 2017-10-25 MED ORDER — TETANUS-DIPHTH-ACELL PERTUSSIS 5-2.5-18.5 LF-MCG/0.5 IM SUSP: 0.5000 mL | Freq: Once | INTRAMUSCULAR | Status: DC

## 2017-10-25 MED ORDER — PRENATAL MULTIVITAMIN CH
1.0000 | ORAL_TABLET | Freq: Every day | ORAL | Status: DC
  Administered 2017-10-26 – 2017-10-27 (×2): 1 via ORAL
  Filled 2017-10-25 (×2): qty 1

## 2017-10-25 MED ORDER — OXYTOCIN 40 UNITS IN LACTATED RINGERS INFUSION - SIMPLE MED
2.5000 [IU]/h | INTRAVENOUS | Status: DC
  Filled 2017-10-25: qty 1000

## 2017-10-25 MED ORDER — OXYTOCIN BOLUS FROM INFUSION
500.0000 mL | Freq: Once | INTRAVENOUS | Status: AC
  Administered 2017-10-25: 500 mL via INTRAVENOUS

## 2017-10-25 MED ORDER — FENTANYL CITRATE (PF) 100 MCG/2ML IJ SOLN: 50.0000 ug | INTRAMUSCULAR | Status: DC | PRN

## 2017-10-25 MED ORDER — FLEET ENEMA 7-19 GM/118ML RE ENEM: 1.0000 | ENEMA | Freq: Every day | RECTAL | Status: DC | PRN

## 2017-10-25 MED ORDER — ZOLPIDEM TARTRATE 5 MG PO TABS: 5.0000 mg | ORAL_TABLET | Freq: Every evening | ORAL | Status: DC | PRN

## 2017-10-25 MED ORDER — MEDROXYPROGESTERONE ACETATE 150 MG/ML IM SUSP: 150.0000 mg | INTRAMUSCULAR | Status: DC | PRN

## 2017-10-25 MED ORDER — DIBUCAINE 1 % RE OINT: 1.0000 "application " | TOPICAL_OINTMENT | RECTAL | Status: DC | PRN

## 2017-10-25 MED ORDER — LACTATED RINGERS IV SOLN
INTRAVENOUS | Status: DC
  Administered 2017-10-25: 03:00:00 via INTRAVENOUS

## 2017-10-25 MED ORDER — BENZOCAINE-MENTHOL 20-0.5 % EX AERO
1.0000 "application " | INHALATION_SPRAY | CUTANEOUS | Status: DC | PRN
  Administered 2017-10-25: 1 via TOPICAL
  Filled 2017-10-25: qty 56

## 2017-10-25 MED ORDER — DIPHENHYDRAMINE HCL 50 MG/ML IJ SOLN: 12.5000 mg | INTRAMUSCULAR | Status: DC | PRN

## 2017-10-25 MED ORDER — ONDANSETRON HCL 4 MG/2ML IJ SOLN: 4.0000 mg | INTRAMUSCULAR | Status: DC | PRN

## 2017-10-25 MED ORDER — LACTATED RINGERS IV SOLN: 500.0000 mL | INTRAVENOUS | Status: DC | PRN

## 2017-10-25 MED ORDER — TERBUTALINE SULFATE 1 MG/ML IJ SOLN: 0.2500 mg | Freq: Once | INTRAMUSCULAR | Status: DC | PRN

## 2017-10-25 MED ORDER — OXYCODONE-ACETAMINOPHEN 5-325 MG PO TABS: 2.0000 | ORAL_TABLET | ORAL | Status: DC | PRN

## 2017-10-25 MED ORDER — WITCH HAZEL-GLYCERIN EX PADS: 1.0000 "application " | MEDICATED_PAD | CUTANEOUS | Status: DC | PRN

## 2017-10-25 MED ORDER — PHENYLEPHRINE 40 MCG/ML (10ML) SYRINGE FOR IV PUSH (FOR BLOOD PRESSURE SUPPORT)
80.0000 ug | PREFILLED_SYRINGE | INTRAVENOUS | Status: DC | PRN
  Filled 2017-10-25: qty 10
  Filled 2017-10-25: qty 5

## 2017-10-25 MED ORDER — MISOPROSTOL 25 MCG QUARTER TABLET
25.0000 ug | ORAL_TABLET | ORAL | Status: DC | PRN
  Administered 2017-10-25 (×2): 25 ug via ORAL
  Filled 2017-10-25 (×3): qty 1

## 2017-10-25 NOTE — Progress Notes (Signed)
CSW acknowledges NICU admission.    Patient screened out for psychosocial assessment since none of the following apply:  Psychosocial stressors documented in mother or baby's chart  Gestation less than 32 weeks  Code at delivery   Infant with anomalies  Please contact the Clinical Social Worker if specific needs arise, or by MOB's request.       

## 2017-10-25 NOTE — Anesthesia Pain Management Evaluation Note (Signed)
  [facility] Pain Management Visit Note  Patient: Jasmine Mcbride, 32 y.o., female  "Hello I am a member of the anesthesia team at [facility]. We have an anesthesia team available at all times to provide care throughout the hospital, including epidural management and anesthesia for C-section. I don't know your plan for the delivery whether it a natural birth, water birth, IV sedation, nitrous supplementation, doula or epidural, but we want to meet your pain goals."   1.Was your pain managed to your expectations on prior hospitalizations?   Yes   2.What is your expectation for pain management during this hospitalization?     Epidural  3.How can we help you reach that goal? epidural  Record the patient's initial score and the patient's pain goal.   Pain: 2/10  Pain Goal: 1/10 [facility] wants you to be able to say your pain was always managed very well.  Salome Arnt 10/25/2017

## 2017-10-25 NOTE — Anesthesia Procedure Notes (Signed)
Epidural Patient location during procedure: OB  Staffing Anesthesiologist: Emerick Weatherly, MD Performed: anesthesiologist   Preanesthetic Checklist Completed: patient identified, site marked, surgical consent, pre-op evaluation, timeout performed, IV checked, risks and benefits discussed and monitors and equipment checked  Epidural Patient position: sitting Prep: DuraPrep Patient monitoring: heart rate, continuous pulse ox and blood pressure Approach: midline Location: L3-L4 Injection technique: LOR saline  Needle:  Needle type: Tuohy  Needle gauge: 17 G Needle length: 9 cm and 9 Needle insertion depth: 6 cm Catheter type: closed end flexible Catheter size: 20 Guage Catheter at skin depth: 10 cm Test dose: negative  Assessment Events: blood not aspirated, injection not painful, no injection resistance, negative IV test and no paresthesia  Additional Notes Patient identified. Risks/Benefits/Options discussed with patient including but not limited to bleeding, infection, nerve damage, paralysis, failed block, incomplete pain control, headache, blood pressure changes, nausea, vomiting, reactions to medication both or allergic, itching and postpartum back pain. Confirmed with bedside nurse the patient's most recent platelet count. Confirmed with patient that they are not currently taking any anticoagulation, have any bleeding history or any family history of bleeding disorders. Patient expressed understanding and wished to proceed. All questions were answered. Sterile technique was used throughout the entire procedure. Please see nursing notes for vital signs. Test dose was given through epidural needle and negative prior to continuing to dose epidural or start infusion. Warning signs of high block given to the patient including shortness of breath, tingling/numbness in hands, complete motor block, or any concerning symptoms with instructions to call for help. Patient was given  instructions on fall risk and not to get out of bed. All questions and concerns addressed with instructions to call with any issues.     

## 2017-10-25 NOTE — H&P (Signed)
Updated H/P   Jasmine Mcbride 32 y.o. G2P0010 @ [redacted]w[redacted]d with PPROM since 10/23/17 has been in house since that time and is being induced tonight per MFM recommendation. She has been given latency abx and a completed course of betamethasone and magnesium sulfate for 12 hours for CP prophylaxis.  ROS- all systems negative except for occasional leaking of amniotic fluid Exam-  Alert oriented x3 Heart -RRR Lungs- CTA Abd- NT Extremities- wnl, no s/sx of dvt  FHT's- baseline 125 with positive accels Uc's rare  Prenatal labs: ABO, Rh:  A+ Antibody:  Negative Rubella:  Immune RPR:   Negative HBsAg:   Nonreactive HIV:   Nonreactive  GBS:  Positive  EFW- MFM U/S on 10/23/17 2305gm )5pounds 1oz0 63%  BP (!) 103/51   Pulse 71   Temp 98.2 F (36.8 C) (Oral)   Resp 16   Ht  (1.549 m)   Wt 169 lb (76.7 kg)   LMP 03/05/2017 (Exact Date)   SpO2 98%   BMI 31.93 kg/m    CBC and updated type and screen pending  A1 IUP @ 34 weeks    2. PPROM    3. GBS positive    4. Cervical ripening; Pitocin Induction P1 Transfer to L/D    2. Routine L/D orders   3.Cytotec PO for cervical ripening   4.PCN for GBS prophylaxis   5. Epidural prn   6. Anticipate vaginal delivery  Illene Bolus CNM 10/25/17 240am

## 2017-10-25 NOTE — Anesthesia Preprocedure Evaluation (Signed)

## 2017-10-26 LAB — CBC
HEMATOCRIT: 32.5 % — AB (ref 36.0–46.0)
HEMOGLOBIN: 10.4 g/dL — AB (ref 12.0–15.0)
MCH: 28.4 pg (ref 26.0–34.0)
MCHC: 32 g/dL (ref 30.0–36.0)
MCV: 88.8 fL (ref 78.0–100.0)
Platelets: 229 10*3/uL (ref 150–400)
RBC: 3.66 MIL/uL — AB (ref 3.87–5.11)
RDW: 14.1 % (ref 11.5–15.5)
WBC: 12.5 10*3/uL — AB (ref 4.0–10.5)

## 2017-10-26 NOTE — Lactation Note (Signed)
This note was copied from a baby's chart. Lactation Consultation Note  Patient Name: Jasmine Mcbride [id number] Date: 10/26/2017 Reason for consult: Initial assessment;NICU baby;Late-preterm 34-36.6wks;Infant < 6lbs;1st time breastfeeding;Primapara;Mother's request   Initial assessment with mom in the NICU. Infant was latched to the right breast in the football hold by RN. Mom reports infant did latch and suckle for a brief period. Infant came off breast as LC entered room. Mom held him at the breast and he was being NG fed at the same time.   Mom reports she is pumping with DEBP on Initiate setting and she obtained 5-10 cc colostrum that was brought to infant. Enc mom to pump 8-12 x in 24 hours and follow with hand expression, mom needs to be taught hand expression. Reviewed colostrum and milk coming to volume. Reviewed with mom what to expect with LPT infant related to BF.  Mom has breast milk labels and containers for storage. Mom has ordered a pump from her insurance company, she is unsure of brand and whether it has arrived yet. Reviewed with mom that pumping rooms in the NICU. Mom to let us know if her pump is here before d/c.   Parents report they have no further questions/concerns at this time. Will attempt to see mom in there PP room to teach hand expression later today.    Maternal Data Formula Feeding for Exclusion: No Has patient been taught Hand Expression?: No Does the patient have breastfeeding experience prior to this delivery?: No  Feeding Feeding Type: Formula  LATCH Score                   Interventions Interventions: Breast feeding basics reviewed;Support pillows;Skin to skin  Lactation Tools Discussed/Used Pump Review: Setup, frequency, and cleaning;Milk Storage Initiated by:: RN, reviewed and encouraged 8-12 x a day and follow with hand expression   Consult Status Consult Status: Follow-up Date: 10/27/17 Follow-up type: In-patient    Jasmine Mcbride  Jasmine Mcbride 10/26/2017, 11:45 AM

## 2017-10-26 NOTE — Lactation Note (Signed)
This note was copied from a baby's chart. Lactation Consultation Note  Patient Name: Jasmine Mcbride [id number] Date: 10/26/2017 Reason for consult: Follow-up assessment;NICU baby;1st time breastfeeding;Primapara;Late-preterm 34-36.6wks   Follow up with mom in her room. BF Resources handout and LC Brochure given, mom informed of IP/OP Services, BF Support Groups and LC phone #.   Providing Milk for Your Baby in NICU booklet given, reviewed pumping and labeling and storage of breast milk for the NICU infant.   Worked with mom on hand expression, large droplets of milk readily available. Enc mom to hand express on both breasts after pumping each time. Mom voiced understanding.    Maternal Data Formula Feeding for Exclusion: No Has patient been taught Hand Expression?: Yes Does the patient have breastfeeding experience prior to this delivery?: No  Feeding Feeding Type: Breast Fed Length of feed: 15 min  LATCH Score                   Interventions Interventions: Hand express  Lactation Tools Discussed/Used Pump Review: Setup, frequency, and cleaning;Milk Storage Initiated by:: RN, reviewed and encouraged 8-12 x a day and follow with hand expression   Consult Status Consult Status: Follow-up Date: 10/26/17 Follow-up type: In-patient    Silas Flood Maleta Pacha 10/26/2017, 12:44 PM

## 2017-10-26 NOTE — Anesthesia Postprocedure Evaluation (Signed)
Anesthesia Post Note  Patient: Jasmine Mcbride  Procedure(s) Performed: AN AD HOC LABOR EPIDURAL     Patient location during evaluation: Mother Baby Anesthesia Type: Epidural Level of consciousness: awake and alert and oriented Pain management: satisfactory to patient Vital Signs Assessment: post-procedure vital signs reviewed and stable Respiratory status: spontaneous breathing and nonlabored ventilation Cardiovascular status: stable Postop Assessment: no headache, no backache, no signs of nausea or vomiting, adequate PO intake and patient able to bend at knees (patient up walking) Anesthetic complications: no    Last Vitals:  Vitals:   10/26/17 0534 10/26/17 0745  BP: 125/80 106/72  Pulse: 84 69  Resp: 16 18  Temp: 36.7 C   SpO2: 99%     Last Pain:  Vitals:   10/26/17 0535  TempSrc:   PainSc: 5    Pain Goal: Patients Stated Pain Goal: 3 (10/26/17 0535)               Madison Hickman

## 2017-10-26 NOTE — Progress Notes (Signed)
Subjective: Postpartum Day 1: Pt stable in room with significant  Other, resting, just returned from  the NICU, tolerating ambulation well, repots passing gas and urinating well, tolerates po feeds.  Vaginal delivery, labial tear. Patient up ad lib, reports no syncope or dizziness. Feeding:  Br/BT Contraceptive plan:  Undecided, but reviewed all forms.   Objective: Vital signs in last 24 hours: Temp:  [97.8 F (36.6 C)-98.4 F (36.9 C)] 97.8 F (36.6 C) (05/14 2312) Pulse Rate:  [67-169] 73 (05/14 2312) Resp:  [16-18] 18 (05/14 2312) BP: (98-131)/(40-97) 100/63 (05/14 2312) SpO2:  [96 %-100 %] 99 % (05/14 2312)  Physical Exam:  General: alert, cooperative and appears stated age Lochia: appropriate Uterine Fundus: firm Perineum: healing well, no significant drainage, no significant erythema DVT Evaluation: No evidence of DVT seen on physical exam. Negative Homan's sign. No cords or calf tenderness. No significant calf/ankle edema.   CBC Latest Ref Rng & Units 10/25/2017 10/23/2017 05/11/2016  WBC 4.0 - 10.5 K/uL 12.2(H) 8.2 10.0  Hemoglobin 12.0 - 15.0 g/dL 4.0(J) 8.1(X) 91.4  Hematocrit 36.0 - 46.0 % 27.6(L) 30.7(L) 42.1  Platelets 150 - 400 K/uL 212 210 309     Assessment/Plan: Status post vaginal delivery day 1. Stable Continue current care. Plan for discharge tomorrow, Breastfeeding, Lactation consult and Contraception undecided     Jasmine Mcbride, Jasmine Mcbride 10/26/2017, 1:19 AM

## 2017-10-27 MED ORDER — IBUPROFEN 600 MG PO TABS: 600.0000 mg | ORAL_TABLET | Freq: Four times a day (QID) | ORAL | 0 refills | Status: DC | PRN

## 2017-10-27 NOTE — Progress Notes (Signed)
Pt discharged to home with significant other and mother.  Pt ambulated to the car with Hortencia Conradi, NT.  No equipment ordered for home. Pt plans to rent breast pump from gift shop to use while she waits for her insurance company to ship one to her house.

## 2017-10-27 NOTE — Lactation Note (Signed)
This note was copied from a baby's chart. Lactation Consultation Note; Follow up visit with this mom of NICU baby. She reports she pumped 4 times yesterday and latched the baby twice. Reports he did latch and take several sucks. Encouraged to try to pump 8 times/ 24 hours. Is getting pump for home from insurance company- plans to call them again today and check on where they are with that and if she will need one from [place] until it comes. No questions at present. To call prn  Patient Name: Jasmine Mcbride [id number] Date: 10/27/2017 Reason for consult: Follow-up assessment;Late-preterm 34-36.6wks;NICU baby   Maternal Data Formula Feeding for Exclusion: No Has patient been taught Hand Expression?: Yes Does the patient have breastfeeding experience prior to this delivery?: No  Feeding Feeding Type: Donor Breast Milk  LATCH Score                   Interventions    Lactation Tools Discussed/Used WIC Program: No   Consult Status Consult Status: Follow-up Date: 10/28/17 Follow-up type: In-patient    Pamelia Hoit 10/27/2017, 7:33 AM

## 2017-10-28 ENCOUNTER — Telehealth (HOSPITAL_COMMUNITY): Payer: Self-pay | Admitting: Lactation Services

## 2017-10-28 NOTE — Telephone Encounter (Signed)
Mother rented a pump from the gift shop and cannot get it to work.  She stated that she tried different outlets but it is not working.  There is a cord that she cannot seem to figure out what it belongs to.  I have never seen the pumps so did not know how to direct her call, other than, to bring it back in today for assistance.  There were no parts to attach this extra cord and no instructions with pump.

## 2017-11-12 NOTE — Discharge Summary (Signed)
OB Discharge Summary     Patient Name: Jasmine Mcbride DOB: 09/08/1985 MRN: [id number]  Date of admission: 10/22/2017 Delivering MD: Osborn CohoOBERTS, ANGELA   Date of discharge: 11/12/2017  Admitting diagnosis: 33wks water broke ctx  Intrauterine pregnancy: 2539w0d     Secondary diagnosis:  Principal Problem:   Preterm delivery     Discharge diagnosis: Preterm Pregnancy Delivered                                                                                                Post partum procedures:na  Augmentation: na  Complications: None  Hospital course:  Onset of Labor With Vaginal Delivery     32 y.o. yo [id number] at 9839w0d was admitted in Active Labor on 10/22/2017. Patient had an uncomplicated labor course as follows:  Membrane Rupture Time/Date: 9:30 PM ,10/22/2017   Intrapartum Procedures: Episiotomy: None [1]                                         Lacerations:  Labial [10]  Patient had a delivery of a Viable infant. 10/25/2017  Information for the patient's newborn:  Jasmine Mcbride [[id number]][[id number]]  Delivery Method: Vaginal, Spontaneous(Filed from Delivery Summary)    Pateint had an uncomplicated postpartum course.  She is ambulating, tolerating a regular diet, passing flatus, and urinating well. Patient is discharged home in stable condition on 11/12/17.   Physical exam  Vitals:   10/26/17 1548 10/26/17 2122 10/27/17 0507 10/27/17 1226  BP:   (!) 93/59 117/70  Pulse: 81 90 86 90  Resp: 18 17 18 16   Temp: 98.2 F (36.8 C) (!) 97.3 F (36.3 C) 98.4 F (36.9 C) 98 F (36.7 C)  TempSrc: Oral Oral Oral Oral  SpO2: 100% 99% 100% 100%  Weight:      Height:       General: alert, cooperative and no distress Lochia: appropriate Uterine Fundus: firm Incision: N/A DVT Evaluation: No evidence of DVT seen on physical exam. Negative Homan's sign. No cords or calf tenderness. No significant calf/ankle edema. Labs: Lab Results  Component Value Date   WBC 12.5 (H) 10/26/2017    HGB 10.4 (L) 10/26/2017   HCT 32.5 (L) 10/26/2017   MCV 88.8 10/26/2017   PLT 229 10/26/2017   CMP Latest Ref Rng & Units 05/11/2016  Glucose 65 - 99 mg/dL 89  BUN 6 - 20 mg/dL 13  Creatinine 6.960.44 - 2.951.00 mg/dL 2.840.67  Sodium 132135 - 440145 mmol/L 138  Potassium 3.5 - 5.1 mmol/L 2.9(L)  Chloride 101 - 111 mmol/L 104  CO2 22 - 32 mmol/L 26  Calcium 8.9 - 10.3 mg/dL 9.4  Total Protein 6.5 - 8.1 g/dL 1.0(U8.6(H)  Total Bilirubin 0.3 - 1.2 mg/dL 7.2(Z1.6(H)  Alkaline Phos 38 - 126 U/L 80  AST 15 - 41 U/L 25  ALT 14 - 54 U/L 19    Discharge instruction: per After Visit Summary and "Baby and Me Booklet".  After visit meds:  Allergies as of 10/27/2017  Reactions   Doxycycline Anaphylaxis, Hives, Shortness Of Breath      Medication List    STOP taking these medications   cetirizine 10 MG tablet Commonly known as:  ZYRTEC   diphenhydrAMINE 25 mg capsule Commonly known as:  BENADRYL     TAKE these medications   calcium carbonate 500 MG chewable tablet Commonly known as:  TUMS - dosed in mg elemental calcium Chew 1 tablet by mouth daily as needed for indigestion or heartburn.   ibuprofen 600 MG tablet Commonly known as:  ADVIL,MOTRIN Take 1 tablet (600 mg total) by mouth every 6 (six) hours as needed for mild pain, moderate pain or cramping.   multivitamin-prenatal 27-0.8 MG Tabs tablet Take 1 tablet by mouth daily at 12 noon.       Diet: routine diet  Activity: Advance as tolerated. Pelvic rest for 6 weeks.   Outpatient follow up:6 weeks Follow up Appt:No future appointments. Follow up Visit:No follow-ups on file.  Postpartum contraception: Undecided  Newborn Data: Live born female  Birth Weight: 4 lb 9.4 oz (2080 g) APGAR: 8, 9  Newborn Delivery   Birth date/time:  10/25/2017 11:14:00 Delivery type:  Vaginal, Spontaneous     Baby Feeding: Bottle and Breast Disposition:home with mother   11/12/2017 [name], CNM

## 2018-03-13 ENCOUNTER — Ambulatory Visit (INDEPENDENT_AMBULATORY_CARE_PROVIDER_SITE_OTHER): Payer: Medicaid Other | Admitting: Obstetrics & Gynecology

## 2018-03-13 ENCOUNTER — Encounter: Payer: Self-pay | Admitting: Obstetrics & Gynecology

## 2018-03-13 VITALS — BP 116/59 | HR 66 | Ht 61.0 in | Wt 149.0 lb

## 2018-03-13 DIAGNOSIS — Z3202 Encounter for pregnancy test, result negative: Secondary | ICD-10-CM

## 2018-03-13 DIAGNOSIS — Z30018 Encounter for initial prescription of other contraceptives: Secondary | ICD-10-CM

## 2018-03-13 DIAGNOSIS — Z30430 Encounter for insertion of intrauterine contraceptive device: Secondary | ICD-10-CM | POA: Diagnosis not present

## 2018-03-13 MED ORDER — LEVONORGESTREL 19.5 MCG/DAY IU IUD
INTRAUTERINE_SYSTEM | Freq: Once | INTRAUTERINE | Status: AC
  Administered 2018-03-13: 1 via INTRAUTERINE

## 2018-03-13 NOTE — Addendum Note (Signed)
Addended by: Kathee Delton on: 03/13/2018 05:01 PM   Modules accepted: Orders

## 2018-03-13 NOTE — Patient Instructions (Addendum)
Intrauterine Device Insertion, Care After This sheet gives you information about how to care for yourself after your procedure. Your health care provider may also give you more specific instructions. If you have problems or questions, contact your health care provider. What can I expect after the procedure? After the procedure, it is common to have:  Cramps and pain in the abdomen.  Light bleeding (spotting) or heavier bleeding that is like your menstrual period. This may last for up to a few days.  Lower back pain.  Dizziness.  Headaches.  Nausea.  Follow these instructions at home:  Before resuming sexual activity, check to make sure that you can feel the IUD string(s). You should be able to feel the end of the string(s) below the opening of your cervix. If your IUD string is in place, you may resume sexual activity. ? If you had a hormonal IUD inserted more than 7 days after your most recent period started, you will need to use a backup method of birth control for 7 days after IUD insertion. Ask your health care provider whether this applies to you.  Continue to check that the IUD is still in place by feeling for the string(s) after every menstrual period, or once a month.  Take over-the-counter and prescription medicines only as told by your health care provider.  Do not drive or use heavy machinery while taking prescription pain medicine.  Keep all follow-up visits as told by your health care provider. This is important. Contact a health care provider if:  You have bleeding that is heavier or lasts longer than a normal menstrual cycle.  You have a fever.  You have cramps or abdominal pain that get worse or do not get better with medicine.  You develop abdominal pain that is new or is not in the same area of earlier cramping and pain.  You feel lightheaded or weak.  You have abnormal or bad-smelling discharge from your vagina.  You have pain during sexual  activity.  You have any of the following problems with your IUD string(s): ? The string bothers or hurts you or your sexual partner. ? You cannot feel the string. ? The string has gotten longer.  You can feel the IUD in your vagina.  You think you may be pregnant, or you miss your menstrual period.  You think you may have an STI (sexually transmitted infection). Get help right away if:  You have flu-like symptoms.  You have a fever and chills.  You can feel that your IUD has slipped out of place. Summary  After the procedure, it is common to have cramps and pain in the abdomen. It is also common to have light bleeding (spotting) or heavier bleeding that is like your menstrual period.  Continue to check that the IUD is still in place by feeling for the string(s) after every menstrual period, or once a month.  Keep all follow-up visits as told by your health care provider. This is important.  Contact your health care provider if you have problems with your IUD string(s), such as the string getting longer or bothering you or your sexual partner. This information is not intended to replace advice given to you by your health care provider. Make sure you discuss any questions you have with your health care provider. Document Released: 01/27/2011 Document Revised: 04/21/2016 Document Reviewed: 04/21/2016 Elsevier Interactive Patient Education  2017 Elsevier Inc. Levonorgestrel intrauterine device (IUD) What is this medicine? LEVONORGESTREL IUD (LEE voe nor   jes trel) is a contraceptive (birth control) device. The device is placed inside the uterus by a healthcare professional. It is used to prevent pregnancy. This device can also be used to treat heavy bleeding that occurs during your period. This medicine may be used for other purposes; ask your health care provider or pharmacist if you have questions. COMMON BRAND NAME(S): Kyleena, LILETTA, Mirena, Skyla What should I tell my health  care provider before I take this medicine? They need to know if you have any of these conditions: -abnormal Pap smear -cancer of the breast, uterus, or cervix -diabetes -endometritis -genital or pelvic infection now or in the past -have more than one sexual partner or your partner has more than one partner -heart disease -history of an ectopic or tubal pregnancy -immune system problems -IUD in place -liver disease or tumor -problems with blood clots or take blood-thinners -seizures -use intravenous drugs -uterus of unusual shape -vaginal bleeding that has not been explained -an unusual or allergic reaction to levonorgestrel, other hormones, silicone, or polyethylene, medicines, foods, dyes, or preservatives -pregnant or trying to get pregnant -breast-feeding How should I use this medicine? This device is placed inside the uterus by a health care professional. Talk to your pediatrician regarding the use of this medicine in children. Special care may be needed. Overdosage: If you think you have taken too much of this medicine contact a poison control center or emergency room at once. NOTE: This medicine is only for you. Do not share this medicine with others. What if I miss a dose? This does not apply. Depending on the brand of device you have inserted, the device will need to be replaced every 3 to 5 years if you wish to continue using this type of birth control. What may interact with this medicine? Do not take this medicine with any of the following medications: -amprenavir -bosentan -fosamprenavir This medicine may also interact with the following medications: -aprepitant -armodafinil -barbiturate medicines for inducing sleep or treating seizures -bexarotene -boceprevir -griseofulvin -medicines to treat seizures like carbamazepine, ethotoin, felbamate, oxcarbazepine, phenytoin, topiramate -modafinil -pioglitazone -rifabutin -rifampin -rifapentine -some medicines to  treat HIV infection like atazanavir, efavirenz, indinavir, lopinavir, nelfinavir, tipranavir, ritonavir -St. John's wort -warfarin This list may not describe all possible interactions. Give your health care provider a list of all the medicines, herbs, non-prescription drugs, or dietary supplements you use. Also tell them if you smoke, drink alcohol, or use illegal drugs. Some items may interact with your medicine. What should I watch for while using this medicine? Visit your doctor or health care professional for regular check ups. See your doctor if you or your partner has sexual contact with others, becomes HIV positive, or gets a sexual transmitted disease. This product does not protect you against HIV infection (AIDS) or other sexually transmitted diseases. You can check the placement of the IUD yourself by reaching up to the top of your vagina with clean fingers to feel the threads. Do not pull on the threads. It is a good habit to check placement after each menstrual period. Call your doctor right away if you feel more of the IUD than just the threads or if you cannot feel the threads at all. The IUD may come out by itself. You may become pregnant if the device comes out. If you notice that the IUD has come out use a backup birth control method like condoms and call your health care provider. Using tampons will not change the position of the   IUD and are okay to use during your period. This IUD can be safely scanned with magnetic resonance imaging (MRI) only under specific conditions. Before you have an MRI, tell your healthcare provider that you have an IUD in place, and which type of IUD you have in place. What side effects may I notice from receiving this medicine? Side effects that you should report to your doctor or health care professional as soon as possible: -allergic reactions like skin rash, itching or hives, swelling of the face, lips, or tongue -fever, flu-like symptoms -genital  sores -high blood pressure -no menstrual period for 6 weeks during use -pain, swelling, warmth in the leg -pelvic pain or tenderness -severe or sudden headache -signs of pregnancy -stomach cramping -sudden shortness of breath -trouble with balance, talking, or walking -unusual vaginal bleeding, discharge -yellowing of the eyes or skin Side effects that usually do not require medical attention (report to your doctor or health care professional if they continue or are bothersome): -acne -breast pain -change in sex drive or performance -changes in weight -cramping, dizziness, or faintness while the device is being inserted -headache -irregular menstrual bleeding within first 3 to 6 months of use -nausea This list may not describe all possible side effects. Call your doctor for medical advice about side effects. You may report side effects to FDA at 1-800-FDA-1088. Where should I keep my medicine? This does not apply. NOTE: This sheet is a summary. It may not cover all possible information. If you have questions about this medicine, talk to your doctor, pharmacist, or health care provider.  2018 Elsevier/Gold Standard (2016-03-12 14:14:56)  

## 2018-03-13 NOTE — Progress Notes (Signed)
Khya DEVANIE GALANTI is a 32 y.o. [phone]  Pt is s/p SVD of a 34 week IUP. She is s/p PROM. She was followed by [facility] but, is transferring her care to [facility].    Patient given informed consent, she signed consent form. Pregnancy test was negative.  Appropriate time out taken.   GYNECOLOGY CLINIC PROCEDURE NOTE  here for Liletta IUD insertion. No GYN concerns.    IUD Insertion Procedure Note Patient identified, informed consent performed.  Discussed risks of irregular bleeding, cramping, infection, malpositioning or misplacement of the IUD outside the uterus which may require further procedures. Time out was performed.  Urine pregnancy test negative.  Speculum placed in the vagina.  Cervix visualized.  Cleaned with Betadine x 2.  Grasped anteriorly with a single tooth tenaculum.  Uterus sounded to 3 cm.  Liletta IUD placed per manufacturer's recommendations.  Strings trimmed to 3 cm. Tenaculum was removed, good hemostasis noted.  Patient tolerated procedure well.   Patient was given post-procedure instructions.  Patient was asked to follow up in 4 weeks for IUD check.  Sabastien Tyler L. Harraway-Smith, M.D., [facility]

## 2018-03-14 LAB — POCT PREGNANCY, URINE: PREG TEST UR: NEGATIVE

## 2018-04-11 ENCOUNTER — Ambulatory Visit (INDEPENDENT_AMBULATORY_CARE_PROVIDER_SITE_OTHER): Payer: Medicaid Other | Admitting: Obstetrics & Gynecology

## 2018-04-11 VITALS — BP 111/67 | HR 59

## 2018-04-11 DIAGNOSIS — Z30431 Encounter for routine checking of intrauterine contraceptive device: Secondary | ICD-10-CM

## 2018-04-11 NOTE — Patient Instructions (Signed)

## 2018-04-11 NOTE — Progress Notes (Signed)
History:  32 y.o. G2P0111 here today for today for IUD string check; Mirena IUD was placed 03/13/2018. No complaints about the Liletta, no concerning side effects.  The following portions of the patient's history were reviewed and updated as appropriate: allergies, current medications, past family history, past medical history, past social history, past surgical history and problem list.    Review of Systems:  Pertinent items are noted in HPI.   Objective:  Physical Exam BP 111/67   Pulse (!) 59   not currently breastfeeding.  CONSTITUTIONAL: Well-developed, well-nourished female in no acute distress.  HENT:  Normocephalic, atraumatic EYES: Conjunctivae and EOM are normal. No scleral icterus.  NECK: Normal range of motion SKIN: Skin is warm and dry. No rash noted. Not diaphoretic.No pallor. NEUROLGIC: Alert and oriented to person, place, and time. Normal coordination.   Abd: Soft, nontender and nondistended Pelvic: Normal appearing external genitalia; normal appearing vaginal mucosa and cervix.  IUD strings visualized, about 3 cm in length outside cervix.   Assessment & Plan:  Normal IUD check. Patient to keep IUD in place for five years; can come in for removal if she desires pregnancy within the next five years. Routine preventative health maintenance measures emphasized.  Tressia Labrum L. Harraway-Smith, M.D., [facility]

## 2018-04-12 ENCOUNTER — Encounter: Payer: Self-pay | Admitting: Obstetrics & Gynecology

## 2018-06-21 ENCOUNTER — Encounter: Payer: Self-pay | Admitting: Family Medicine

## 2018-06-21 ENCOUNTER — Telehealth: Payer: Self-pay

## 2018-06-21 NOTE — Telephone Encounter (Signed)
Pt called stating that she had her IUD inserted and that she was having issues with her PH being "off" and was told to call if she needed an antibiotic for a bacteria infection. Pt stated she can come into the office if she needed to be seen.

## 2018-06-22 ENCOUNTER — Ambulatory Visit (INDEPENDENT_AMBULATORY_CARE_PROVIDER_SITE_OTHER): Payer: BLUE CROSS/BLUE SHIELD | Admitting: General Practice

## 2018-06-22 DIAGNOSIS — Z113 Encounter for screening for infections with a predominantly sexual mode of transmission: Secondary | ICD-10-CM | POA: Diagnosis not present

## 2018-06-22 DIAGNOSIS — N898 Other specified noninflammatory disorders of vagina: Secondary | ICD-10-CM

## 2018-06-22 DIAGNOSIS — N760 Acute vaginitis: Secondary | ICD-10-CM

## 2018-06-22 DIAGNOSIS — B9689 Other specified bacterial agents as the cause of diseases classified elsewhere: Secondary | ICD-10-CM

## 2018-06-22 NOTE — Progress Notes (Signed)
I agree with the nurses note and plan of care.    Venia Carbon I, NP 06/22/2018 3:30 PM

## 2018-06-22 NOTE — Progress Notes (Signed)
Patient presents to office today reporting for the past two weeks she feels her pH balance has been off due to an increase in vaginal discharge with some odor off/on. Patient denies itching or irritation. Patient instructed in self swab & specimen collected. Discussed we will contact her with any abnormal results. Patient verbalized understanding & had no questions.  Chase Caller RN BSN 06/22/18

## 2018-06-22 NOTE — Telephone Encounter (Signed)
Patient came into office to have concerns addressed.

## 2018-06-23 ENCOUNTER — Other Ambulatory Visit: Payer: Self-pay | Admitting: Obstetrics and Gynecology

## 2018-06-23 LAB — CERVICOVAGINAL ANCILLARY ONLY
BACTERIAL VAGINITIS: POSITIVE — AB
CANDIDA VAGINITIS: NEGATIVE
TRICH (WINDOWPATH): NEGATIVE

## 2018-06-23 MED ORDER — METRONIDAZOLE 500 MG PO TABS: 500.0000 mg | ORAL_TABLET | Freq: Two times a day (BID) | ORAL | 0 refills | Status: DC

## 2018-06-23 NOTE — Progress Notes (Signed)
+   BV on wet prep Flagyl sent to pharmacy.  Mychart message sent to patient  Rasch, Harolyn Rutherford, NP 06/23/2018  7:04 PM

## 2018-09-13 ENCOUNTER — Ambulatory Visit: Payer: BLUE CROSS/BLUE SHIELD | Admitting: Obstetrics and Gynecology

## 2018-09-13 ENCOUNTER — Telehealth: Payer: Self-pay | Admitting: Family Medicine

## 2018-09-13 NOTE — Telephone Encounter (Signed)
Stated she did want to complete a phone visit as she requested to have an IUD removed and the phone visit will not be helpful. She would like to reschedule the appointment. Informed of the wait list in May due to COVID19 restrictions. The patient agreed.

## 2019-01-02 ENCOUNTER — Telehealth: Payer: Self-pay | Admitting: Obstetrics and Gynecology

## 2019-01-02 NOTE — Telephone Encounter (Signed)
Attempted to call patient to see if she would like to reschedule her appointment. Her mailbox was full.

## 2019-01-08 ENCOUNTER — Encounter

## 2019-02-12 ENCOUNTER — Telehealth: Payer: Self-pay | Admitting: Family Medicine

## 2019-02-12 NOTE — Telephone Encounter (Signed)
Spoke to patient about her appointment on 9/1 @ 2:00. Patient instructed to wear a face mask for the entire appointment and no visitors are allowed with her during the visit. Patient screened for covid symptoms and denied having any °

## 2019-02-13 ENCOUNTER — Telehealth: Payer: Self-pay | Admitting: General Practice

## 2019-02-13 ENCOUNTER — Ambulatory Visit: Payer: Medicaid Other

## 2019-02-13 DIAGNOSIS — N760 Acute vaginitis: Secondary | ICD-10-CM

## 2019-02-13 DIAGNOSIS — B9689 Other specified bacterial agents as the cause of diseases classified elsewhere: Secondary | ICD-10-CM

## 2019-02-13 MED ORDER — METRONIDAZOLE 500 MG PO TABS: 500.0000 mg | ORAL_TABLET | Freq: Two times a day (BID) | ORAL | 0 refills | Status: DC

## 2019-02-13 NOTE — Telephone Encounter (Signed)
Called patient stating I am calling regarding her appt today for self swab due to potential bacterial infection. Told patient I understand she has had those in the past and asked what symptoms she is having. Patient reports discharge with odor and slight discomfort off an on for a week. Asked patient if this feels like BV to her and she states yes. Told patient I could send Flagyl to her pharmacy now and she wouldn't need to come in today if she would prefer that. Patient verbalized understanding & states yes that would be helpful. Told patient I will send Rx in and cancel appt. Patient verbalized understanding & had no questions.

## 2019-09-14 IMAGING — US US MFM OB COMP +14 WKS
1 series · 14 of 28 positions shown · non-contrast
Comparison: none

[Series 1: us mfm ob comp +14 wks · 38 acquisitions, 14 frames shown]
[im 2/38]
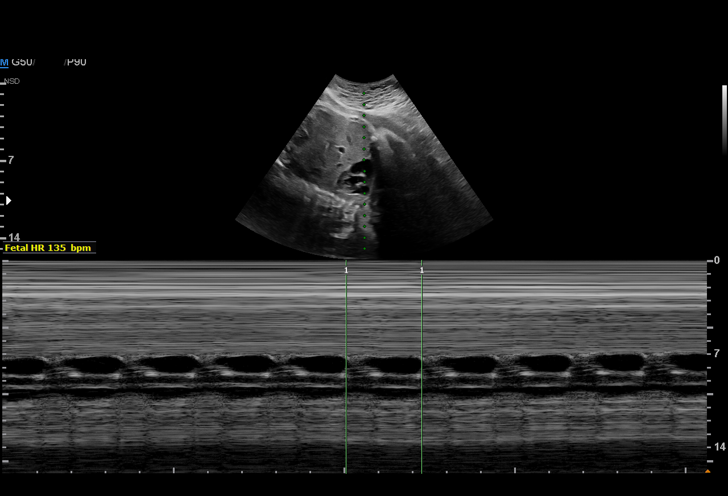
[im 5/38]
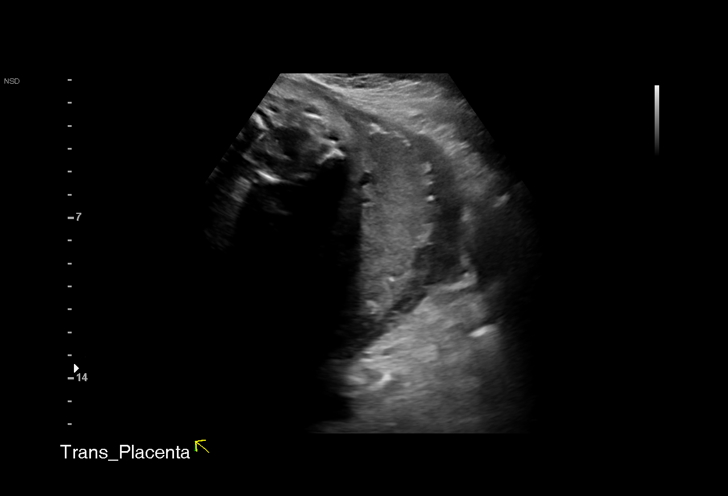
[im 7/38]
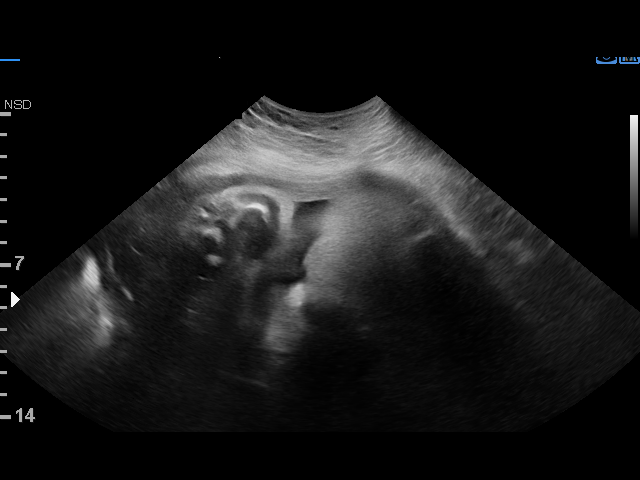
[im 10/38]
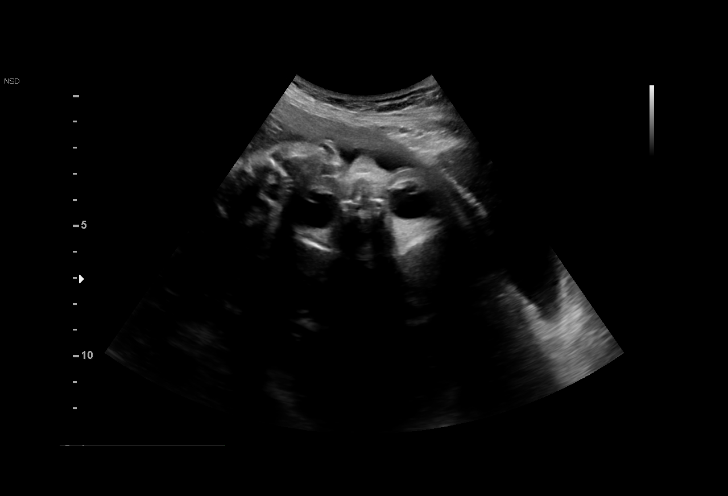
[im 13/38]
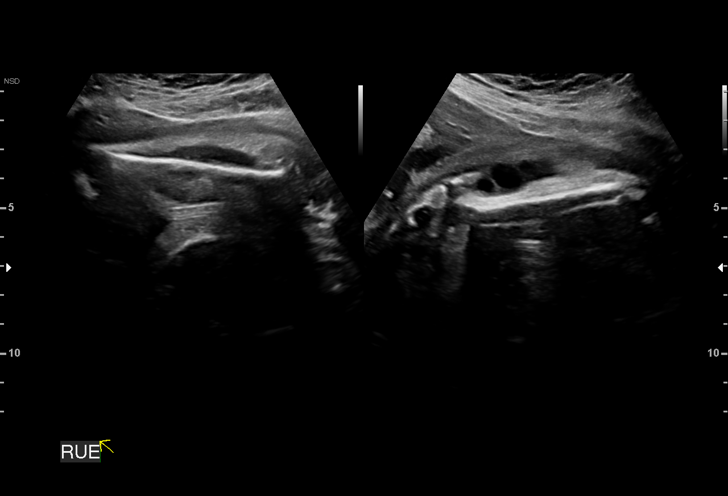
[im 16/38]
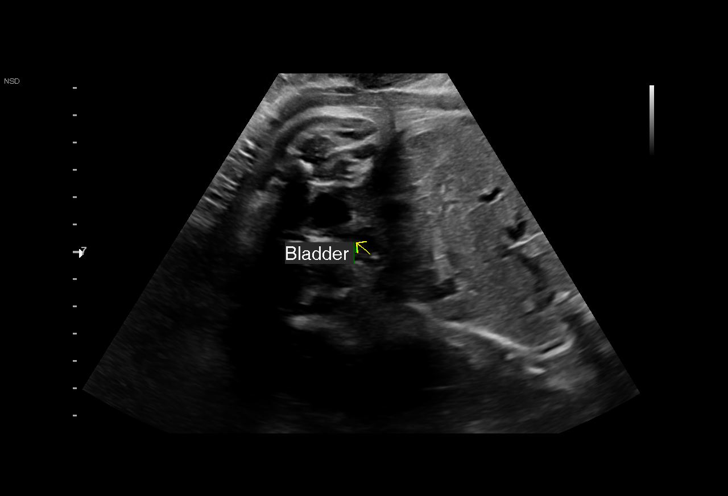
[im 18/38]
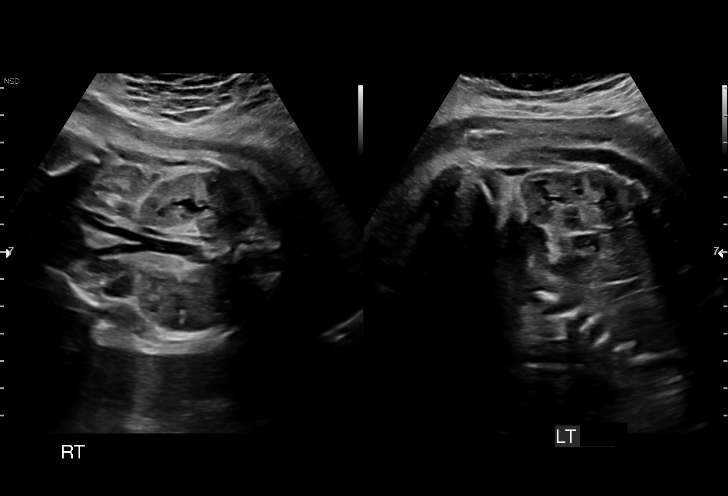
[im 21/38]
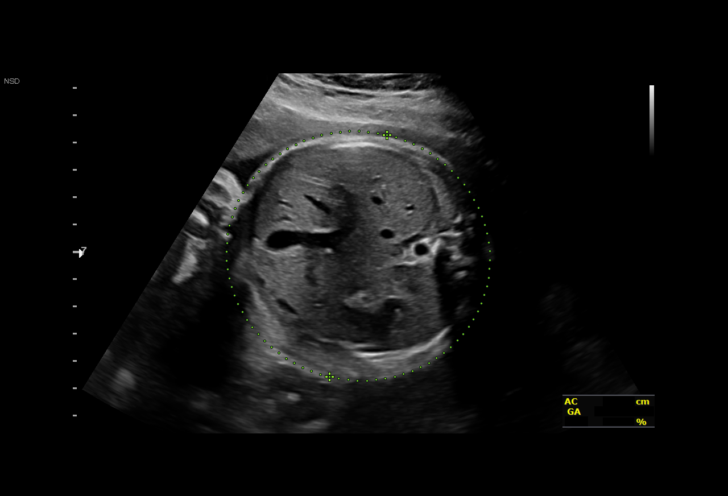
[im 24/38]
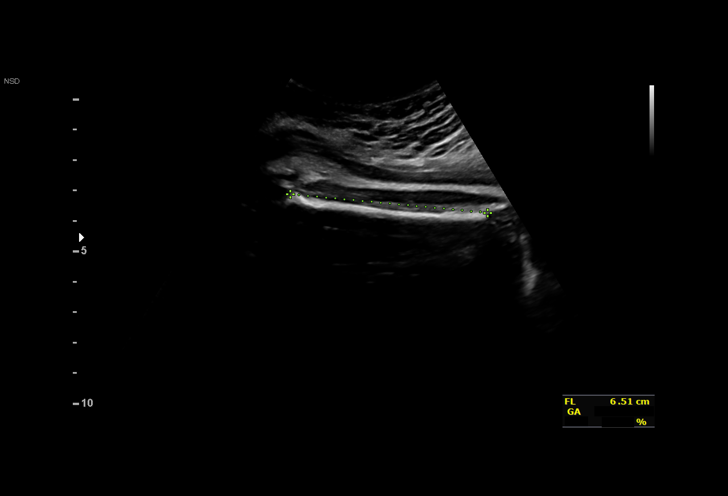
[im 27/38]
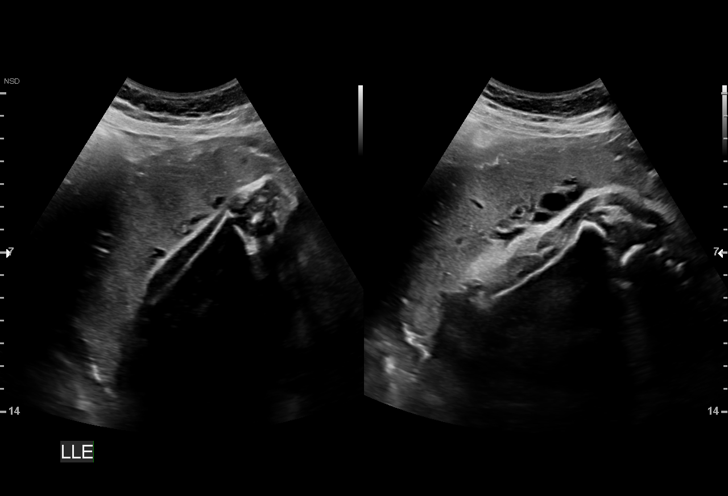
[im 29/38]
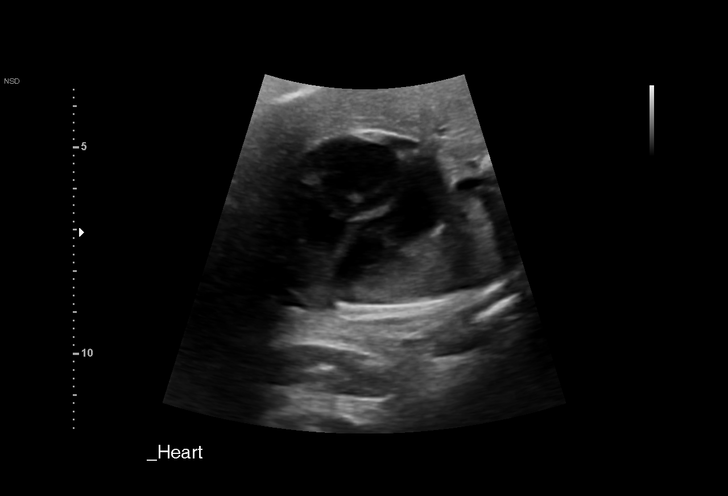
[im 32/38]
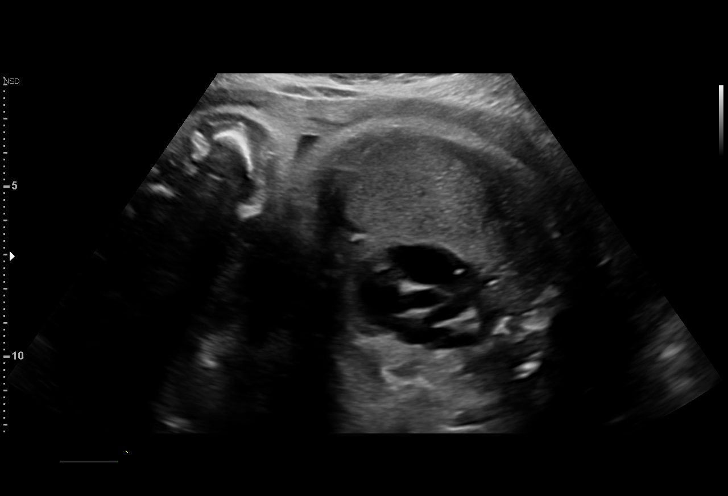
[im 35/38]
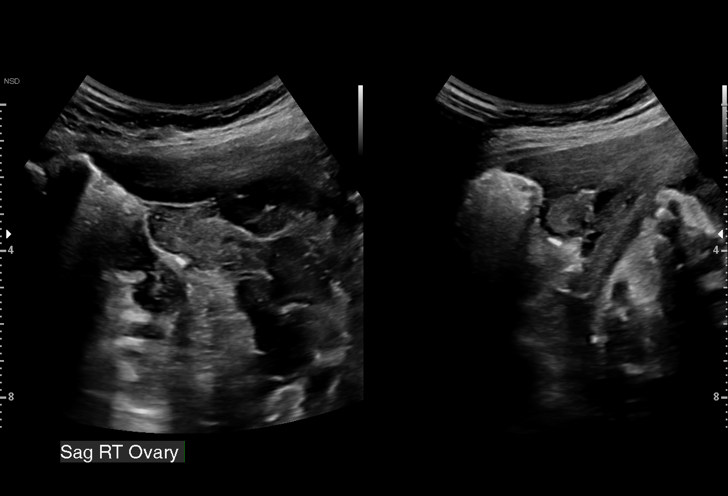
[im 38/38]
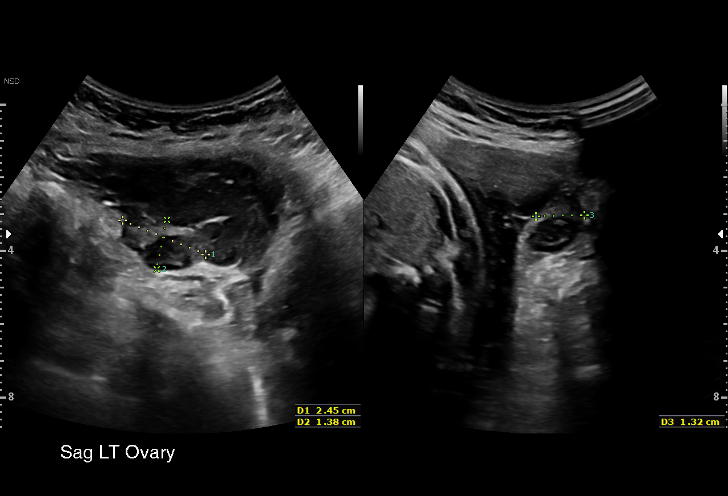

[14 of 28 positions shown; findings below may reference images not displayed]

Attending:        Giorgi Jumper       Secondary Phy.:    3rd Nursing- [facility]
OB

1  NISARG GRIMMETT           [id number]      [id number]     [id number]
Indications

33 weeks gestation of pregnancy
Premature rupture of membranes - leaking
fluid (PPROM)
Encounter for other antenatal screening
follow-up (Growth)
OB History

Gravidity:    2         Term:   0        Prem:   0        SAB:   1
TOP:          0       Ectopic:  0        Living: 0
Fetal Evaluation

Num Of Fetuses:     1
Fetal Heart         135
Rate(bpm):
Cardiac Activity:   Observed
Presentation:       Cephalic
Placenta:           Posterior Fundal, above cervical os
P. Cord Insertion:  Not well visualized

Amniotic Fluid
AFI FV:      Oligohydramnios secondary to ROM

AFI Sum(cm)     %Tile       Largest Pocket(cm)
3.47            < 3

RUQ(cm)       RLQ(cm)       LUQ(cm)        LLQ(cm)
1.49          0.95          1.03           0
Biometry

BPD:        85  mm     G. Age:  34w 1d         62  %    CI:        72.78   %    70 - 86
FL/HC:       20.8  %    19.4 -
HC:      316.8  mm     G. Age:  35w 4d         62  %    HC/AC:       1.07       0.96 -
AC:      294.9  mm     G. Age:  33w 3d         46  %    FL/BPD:      77.6  %    71 - 87
FL:         66  mm     G. Age:  34w 0d         47  %    FL/AC:       22.4  %    20 - 24
HUM:      60.4  mm     G. Age:  35w 0d         83  %

Est. FW:    7652   gm     5 lb 1 oz     63  %
Gestational Age

U/S Today:     34w 2d                                        EDD:   12/02/17
Best:          33w 5d     Det. By:  Early Ultrasound         EDD:   12/06/17
Anatomy

Cranium:               Appears normal         LVOT:                   Not well visualized
Cavum:                 Not well visualized    Aortic Arch:            Not well visualized
Ventricles:            Not well visualized    Ductal Arch:            Not well visualized
Choroid Plexus:        Not well visualized    Diaphragm:              Appears normal
Cerebellum:            Not well visualized    Stomach:                Appears normal, left
sided
Posterior Fossa:       Not well visualized    Abdomen:                Appears normal
Nuchal Fold:           Not applicable (>20    Abdominal Wall:         Not well visualized
wks GA)
Face:                  Orbits appear          Cord Vessels:           Appears normal (3
normal                                         vessel cord)
Lips:                  Not well visualized    Kidneys:                Appear normal
Palate:                Not well visualized    Bladder:                Appears normal
Thoracic:              Appears normal         Spine:                  Not well visualized
Heart:                 Appears normal         Upper Extremities:      Present
(4CH, axis, and situs
RVOT:                  Appears normal         Lower Extremities:      Present

Other:  Technically difficult due to advanced GA and low amniotic fluid.
Complete fetal anatomic survey previously performed in office.
Cervix Uterus Adnexa

Cervix
Not visualized (advanced GA >37wks)

Left Ovary
Within normal limits.

Right Ovary
Within normal limits.

Adnexa:       No abnormality visualized.
Impression

Single living intrauterine pregnancy at 33w 5d, pPROM
Placenta Posterior Fundal, above cervical os.
Appropriate fetal growth.
oligohydramnios
The fetal anatomic survey is not complete.
No gross fetal anomalies identified.
Recommendations

Follow-up ultrasounds as clinically indicated (remote read of
images only).

## 2020-02-19 ENCOUNTER — Ambulatory Visit (INDEPENDENT_AMBULATORY_CARE_PROVIDER_SITE_OTHER): Payer: Medicaid Other | Admitting: Certified Nurse Midwife

## 2020-02-19 ENCOUNTER — Encounter: Payer: Self-pay | Admitting: Certified Nurse Midwife

## 2020-02-19 ENCOUNTER — Other Ambulatory Visit: Payer: Self-pay

## 2020-02-19 ENCOUNTER — Other Ambulatory Visit (HOSPITAL_COMMUNITY)
Admission: RE | Admit: 2020-02-19 | Discharge: 2020-02-19 | Disposition: A | Discharge status: Home / Self Care | Payer: Medicaid Other | Source: Ambulatory Visit | Attending: Certified Nurse Midwife | Admitting: Certified Nurse Midwife

## 2020-02-19 VITALS — BP 114/66 | HR 70 | Ht 61.0 in | Wt 151.0 lb

## 2020-02-19 DIAGNOSIS — Z113 Encounter for screening for infections with a predominantly sexual mode of transmission: Secondary | ICD-10-CM | POA: Diagnosis not present

## 2020-02-19 DIAGNOSIS — Z30011 Encounter for initial prescription of contraceptive pills: Secondary | ICD-10-CM

## 2020-02-19 DIAGNOSIS — Z30432 Encounter for removal of intrauterine contraceptive device: Secondary | ICD-10-CM

## 2020-02-19 MED ORDER — NORGESTIM-ETH ESTRAD TRIPHASIC 0.18/0.215/0.25 MG-25 MCG PO TABS: 1.0000 | ORAL_TABLET | Freq: Every day | ORAL | 11 refills | Status: AC

## 2020-02-19 NOTE — Progress Notes (Signed)
    GYNECOLOGY CLINIC PROCEDURE NOTE  [name] is a 34 y.o. [phone] here for Jasmine Mcbride IUD removal. She has been experiencing weight gain and acne. Also desires STI testing (not blood work). Last pap smear was on 07/15/16 and was normal.  IUD Removal  Patient was in the dorsal lithotomy position, normal external genitalia was noted.  A speculum was placed in the patient's vagina, normal discharge was noted, no lesions. The multiparous cervix was visualized, no lesions, no abnormal discharge.  The strings of the IUD were grasped and pulled using ring forceps. The IUD was removed in its entirety.   Patient has used Ortho Tri-Cyclen for contraception before and wants to do so again.     Assessment & Plan:  1. Screen for STD (sexually transmitted disease) - Cervicovaginal ancillary only( [place])  2. Encounter for initial prescription of contraceptive pills - Norgestimate-Ethinyl Estradiol Triphasic 0.18/0.215/0.25 MG-25 MCG tab; Take 1 tablet by mouth daily.  Dispense: 28 tablet; Refill: 11  3. Encounter for IUD removal - Follow up in one year for annual exam and PAP - Routine preventative health maintenance measures emphasized.  Edd Arbour, CNM, MSN, [facility] 02/19/20 3:16 PM

## 2020-02-21 ENCOUNTER — Encounter: Payer: Self-pay | Admitting: General Practice

## 2020-02-21 LAB — CERVICOVAGINAL ANCILLARY ONLY
Bacterial Vaginitis (gardnerella): POSITIVE — AB
Candida Glabrata: NEGATIVE
Candida Vaginitis: NEGATIVE
Chlamydia: NEGATIVE
Comment: NEGATIVE
Comment: NEGATIVE
Comment: NEGATIVE
Comment: NEGATIVE
Comment: NEGATIVE
Comment: NORMAL
Neisseria Gonorrhea: NEGATIVE
Trichomonas: NEGATIVE

## 2020-02-22 ENCOUNTER — Telehealth: Payer: Self-pay | Admitting: Certified Nurse Midwife

## 2020-02-22 DIAGNOSIS — N760 Acute vaginitis: Secondary | ICD-10-CM

## 2020-02-22 MED ORDER — METRONIDAZOLE 0.75 % VA GEL: 1.0000 | Freq: Every day | VAGINAL | 1 refills | Status: AC

## 2020-02-22 NOTE — Telephone Encounter (Signed)
Positive for bacterial vaginitis. Script sent to pharmacy.

## 2020-03-06 ENCOUNTER — Other Ambulatory Visit: Payer: Self-pay | Admitting: Certified Nurse Midwife

## 2020-03-06 DIAGNOSIS — B3731 Acute candidiasis of vulva and vagina: Secondary | ICD-10-CM

## 2020-03-06 MED ORDER — FLUCONAZOLE 150 MG PO TABS: 150.0000 mg | ORAL_TABLET | Freq: Every day | ORAL | 0 refills | Status: AC

## 2020-03-06 NOTE — Progress Notes (Signed)
Treatment for BV caused vaginal candida, script sent to pharmacy for Diflucan per pt request.

## 2022-01-27 ENCOUNTER — Encounter (HOSPITAL_BASED_OUTPATIENT_CLINIC_OR_DEPARTMENT_OTHER): Payer: Self-pay | Admitting: Emergency Medicine

## 2022-01-27 ENCOUNTER — Emergency Department (HOSPITAL_BASED_OUTPATIENT_CLINIC_OR_DEPARTMENT_OTHER)
Admission: EM | Admit: 2022-01-27 | Discharge: 2022-01-27 | Disposition: A | Discharge status: Home / Self Care | Payer: Medicaid Other | Attending: Emergency Medicine | Admitting: Emergency Medicine

## 2022-01-27 ENCOUNTER — Other Ambulatory Visit: Payer: Self-pay

## 2022-01-27 DIAGNOSIS — N751 Abscess of Bartholin's gland: Secondary | ICD-10-CM | POA: Insufficient documentation

## 2022-01-27 DIAGNOSIS — L0291 Cutaneous abscess, unspecified: Secondary | ICD-10-CM

## 2022-01-27 LAB — URINALYSIS, ROUTINE W REFLEX MICROSCOPIC
Bilirubin Urine: NEGATIVE
Glucose, UA: NEGATIVE mg/dL
Ketones, ur: NEGATIVE mg/dL
Leukocytes,Ua: NEGATIVE
Nitrite: NEGATIVE
Protein, ur: NEGATIVE mg/dL
Specific Gravity, Urine: 1.01 (ref 1.005–1.030)
pH: 7 (ref 5.0–8.0)

## 2022-01-27 LAB — URINALYSIS, MICROSCOPIC (REFLEX): WBC, UA: NONE SEEN WBC/hpf (ref 0–5)

## 2022-01-27 LAB — WET PREP, GENITAL
Clue Cells Wet Prep HPF POC: NONE SEEN
Sperm: NONE SEEN
Trich, Wet Prep: NONE SEEN
WBC, Wet Prep HPF POC: <10 (ref <10)
Yeast Wet Prep HPF POC: NONE SEEN

## 2022-01-27 LAB — PREGNANCY, URINE: Preg Test, Ur: NEGATIVE

## 2022-01-27 MED ORDER — LIDOCAINE HCL (PF) 1 % IJ SOLN
5.0000 mL | Freq: Once | INTRAMUSCULAR | Status: AC
  Administered 2022-01-27: 5 mL via INTRADERMAL

## 2022-01-27 MED ORDER — SULFAMETHOXAZOLE-TRIMETHOPRIM 800-160 MG PO TABS: 1.0000 | ORAL_TABLET | Freq: Two times a day (BID) | ORAL | 0 refills | Status: AC

## 2022-01-27 NOTE — Discharge Instructions (Addendum)
It was a pleasure taking care of you today.  As discussed, I am sending you home with an antibiotic.  Take as prescribed and finish all antibiotics.  You may take over-the-counter ibuprofen or Tylenol as needed for pain.  I have included the number of the OB/GYN.  Please call to schedule an appointment for further evaluation given recurrence of the abscess.  Return to the ER for any worsening symptoms.

## 2022-01-27 NOTE — ED Triage Notes (Signed)
Patient arrived via POV c/o vaginal cyst/abscess. Patient states it has gotten progressively worse over last 3-4 days. Patient states swelling to area. Patient states 8/10 pain. Patient is AO x 4, VS WDL, normal gait.

## 2022-01-27 NOTE — ED Provider Notes (Signed)
[facility] EMERGENCY DEPARTMENT Provider Note   CSN: [id number] Arrival date & time: 01/27/22  2018     History  Chief Complaint  Patient presents with   Abscess    Jasmine Mcbride is a 36 y.o. female with no significant past medical history who presents to the ED due to left labial abscess x3 to 4 days.  No fever or chills.  Denies urinary symptoms.  Admits to some vaginal irritation in which she was prescribed yeast infection medication for a few days ago.  No abdominal pain.  She notes she has had an abscess in the same location roughly 6 times quiring I&D.  History obtained from patient and past medical records. No interpreter used during encounter.       Home Medications Prior to Admission medications   Medication Sig Start Date End Date Taking? Authorizing Provider  sulfamethoxazole-trimethoprim (BACTRIM DS) 800-160 MG tablet Take 1 tablet by mouth 2 (two) times daily for 7 days. 01/27/22 02/03/22 Yes Sire Poet, Merla Riches, PA-C  calcium carbonate (TUMS - DOSED IN MG ELEMENTAL CALCIUM) 500 MG chewable tablet Chew 1 tablet by mouth daily as needed for indigestion or heartburn.  Patient not taking: Reported on 02/19/2020    [provider]  metroNIDAZOLE (METROGEL) 0.75 % vaginal gel Place 1 Applicatorful vaginally at bedtime. Apply one applicatorful to vagina at bedtime for 5 days 02/22/20   Bernerd Limbo, CNM  Multiple Vitamin (MULTIVITAMIN) capsule Take 1 capsule by mouth daily.    [provider]  Norgestimate-Ethinyl Estradiol Triphasic 0.18/0.215/0.25 MG-25 MCG tab Take 1 tablet by mouth daily. 02/19/20   Bernerd Limbo, CNM      Allergies    Doxycycline and Amoxicillin    Review of Systems   Review of Systems  Constitutional:  Negative for fever.  Gastrointestinal:  Negative for abdominal pain, diarrhea and vomiting.  Genitourinary:  Positive for vaginal pain (irritation). Negative for dysuria.  Skin:  Positive for wound (abscess).     Physical Exam Updated Vital Signs BP 118/84 (BP Location: Left Arm)   Pulse 86   Temp 98.2 F (36.8 C) (Oral)   Resp 18   Ht 5\' 1"  (1.549 m)   Wt 74.8 kg   LMP 01/20/2022 (Exact Date)   SpO2 100%   BMI 31.18 kg/m  Physical Exam Vitals and nursing note reviewed.  Constitutional:      General: She is not in acute distress.    Appearance: She is not ill-appearing.  HENT:     Head: Normocephalic.  Eyes:     Pupils: Pupils are equal, round, and reactive to light.  Cardiovascular:     Rate and Rhythm: Normal rate and regular rhythm.     Pulses: Normal pulses.     Heart sounds: Normal heart sounds. No murmur heard.    No friction rub. No gallop.  Pulmonary:     Effort: Pulmonary effort is normal.     Breath sounds: Normal breath sounds.  Abdominal:     General: Abdomen is flat. There is no distension.     Palpations: Abdomen is soft.     Tenderness: There is no abdominal tenderness. There is no guarding or rebound.  Genitourinary:    Comments: GU exam performed with chaperone in room. 2x3cm abscess to left labia.  Musculoskeletal:        General: Normal range of motion.     Cervical back: Neck supple.  Skin:    General: Skin is warm and  dry.  Neurological:     General: No focal deficit present.     Mental Status: She is alert.  Psychiatric:        Mood and Affect: Mood normal.        Behavior: Behavior normal.     ED Results / Procedures / Treatments   Labs (all labs ordered are listed, but only abnormal results are displayed) Labs Reviewed  URINALYSIS, ROUTINE W REFLEX MICROSCOPIC - Abnormal; Notable for the following components:      Result Value   Hgb urine dipstick MODERATE (*)    All other components within normal limits  URINALYSIS, MICROSCOPIC (REFLEX) - Abnormal; Notable for the following components:   Bacteria, UA RARE (*)    All other components within normal limits  WET PREP, GENITAL  PREGNANCY, URINE    EKG None  Radiology No results  found.  Procedures .Marland KitchenIncision and Drainage  Date/Time: 01/27/2022 10:55 PM  Performed by: Mannie Stabile, PA-C Authorized by: Mannie Stabile, PA-C   Consent:    Consent obtained:  Verbal   Consent given by:  Patient   Risks discussed:  Bleeding, incomplete drainage, pain and damage to other organs   Alternatives discussed:  No treatment Universal protocol:    Procedure explained and questions answered to patient or proxy's satisfaction: yes     Relevant documents present and verified: yes     Test results available : yes     Imaging studies available: yes     Required blood products, implants, devices, and special equipment available: yes     Site/side marked: yes     Immediately prior to procedure, a time out was called: yes     Patient identity confirmed:  Verbally with patient Location:    Type:  Bartholin cyst   Size:  2x3   Location:  Anogenital   Anogenital location:  Bartholin's gland Pre-procedure details:    Skin preparation:  Betadine Anesthesia:    Anesthesia method:  Local infiltration   Local anesthetic:  Lidocaine 1% w/o epi Procedure type:    Complexity:  Complex Procedure details:    Incision types:  Single straight   Incision depth:  Subcutaneous   Wound management:  Probed and deloculated, irrigated with saline and extensive cleaning   Drainage:  Purulent   Drainage amount:  Moderate   Packing materials:  None Post-procedure details:    Procedure completion:  Tolerated well, no immediate complications     Medications Ordered in ED Medications  lidocaine (PF) (XYLOCAINE) 1 % injection 5 mL (5 mLs Intradermal Given 01/27/22 2211)    ED Course/ Medical Decision Making/ A&P                           Medical Decision Making Amount and/or Complexity of Data Reviewed Labs: ordered. Decision-making details documented in ED Course.  Risk Prescription drug management.   36 year old female presents to the ED due to left labial abscess that  has been present for the past 3 to 4 days.  She also endorses some vaginal irritation which she was prescribed yeast infection medication a few days ago.  Upon arrival, stable vitals.  Patient in no acute distress.  2 x 3 cm left labial abscess.  Patient preferred to self swab for wet prep which I find to be reasonable given my suspicion for PID is low.  Abdomen soft, nondistended, nontender.  Low suspicion for acute abdomen.  I&D performed  as noted above.  Wet prep negative. UA significant for hematuria. No signs of infection. Patient discharged with antibiotics and pain medication.  OB/GYN referral given to patient due to recurrence of abscess. Strict ED precautions discussed with patient. Patient states understanding and agrees to plan. Patient discharged home in no acute distress and stable vitals        Final Clinical Impression(s) / ED Diagnoses Final diagnoses:  Abscess    Rx / DC Orders ED Discharge Orders          Ordered    sulfamethoxazole-trimethoprim (BACTRIM DS) 800-160 MG tablet  2 times daily        01/27/22 2224              Mannie Stabile, PA-C 01/27/22 2305    Melene Plan, DO 01/27/22 2311

## 2022-01-27 NOTE — ED Notes (Signed)
I&D tray at bedside, pt undressed, blanket provided for comfort

## 2022-05-26 DIAGNOSIS — E669 Obesity, unspecified: Secondary | ICD-10-CM | POA: Diagnosis not present

## 2022-05-26 DIAGNOSIS — F5101 Primary insomnia: Secondary | ICD-10-CM | POA: Diagnosis not present

## 2022-05-26 DIAGNOSIS — Z6831 Body mass index (BMI) 31.0-31.9, adult: Secondary | ICD-10-CM | POA: Diagnosis not present

## 2022-07-08 DIAGNOSIS — N758 Other diseases of Bartholin's gland: Secondary | ICD-10-CM | POA: Diagnosis not present

## 2022-07-29 DIAGNOSIS — R400 Somnolence: Secondary | ICD-10-CM | POA: Diagnosis not present

## 2022-07-29 DIAGNOSIS — R0683 Snoring: Secondary | ICD-10-CM | POA: Diagnosis not present

## 2022-07-29 DIAGNOSIS — G4700 Insomnia, unspecified: Secondary | ICD-10-CM | POA: Diagnosis not present

## 2022-08-05 DIAGNOSIS — Z1151 Encounter for screening for human papillomavirus (HPV): Secondary | ICD-10-CM | POA: Diagnosis not present

## 2022-08-05 DIAGNOSIS — Z0000 Encounter for general adult medical examination without abnormal findings: Secondary | ICD-10-CM | POA: Diagnosis not present

## 2022-08-05 DIAGNOSIS — Z113 Encounter for screening for infections with a predominantly sexual mode of transmission: Secondary | ICD-10-CM | POA: Diagnosis not present

## 2022-10-21 DIAGNOSIS — N750 Cyst of Bartholin's gland: Secondary | ICD-10-CM | POA: Diagnosis not present
